# Patient Record
Sex: Male | Born: 1950 | Race: White | Hispanic: No | Marital: Married | Smoking: Never smoker
Health system: Southern US, Community
[De-identification: ages and names within clinical notes are randomized; demographics above are authoritative.]

## PROBLEM LIST (undated history)

## (undated) DIAGNOSIS — Z8616 Personal history of COVID-19: Secondary | ICD-10-CM

## (undated) DIAGNOSIS — I4819 Other persistent atrial fibrillation: Secondary | ICD-10-CM

## (undated) DIAGNOSIS — I517 Cardiomegaly: Secondary | ICD-10-CM

## (undated) DIAGNOSIS — I340 Nonrheumatic mitral (valve) insufficiency: Secondary | ICD-10-CM

## (undated) DIAGNOSIS — Z7901 Long term (current) use of anticoagulants: Secondary | ICD-10-CM

## (undated) DIAGNOSIS — I4510 Unspecified right bundle-branch block: Secondary | ICD-10-CM

## (undated) DIAGNOSIS — I7300 Raynaud's syndrome without gangrene: Secondary | ICD-10-CM

## (undated) DIAGNOSIS — C61 Malignant neoplasm of prostate: Secondary | ICD-10-CM

## (undated) HISTORY — PX: MOHS SURGERY: SHX181

## (undated) HISTORY — DX: Raynaud's syndrome without gangrene: I73.00

## (undated) HISTORY — DX: Nonrheumatic mitral (valve) insufficiency: I34.0

## (undated) HISTORY — PX: COLONOSCOPY: SHX174

## (undated) HISTORY — PX: SKIN SURGERY: SHX2413

## (undated) HISTORY — DX: Other persistent atrial fibrillation: I48.19

## (undated) HISTORY — DX: Cardiomegaly: I51.7

---

## 1999-07-03 ENCOUNTER — Encounter: Payer: Self-pay | Admitting: *Deleted

## 1999-07-03 ENCOUNTER — Encounter: Admission: RE | Admit: 1999-07-03 | Discharge: 1999-07-03 | Payer: Self-pay | Admitting: *Deleted

## 1999-10-23 ENCOUNTER — Encounter: Payer: Self-pay | Admitting: *Deleted

## 1999-10-23 ENCOUNTER — Encounter: Admission: RE | Admit: 1999-10-23 | Discharge: 1999-10-23 | Payer: Self-pay | Admitting: *Deleted

## 1999-11-06 ENCOUNTER — Encounter: Payer: Self-pay | Admitting: Neurology

## 1999-11-06 ENCOUNTER — Ambulatory Visit (HOSPITAL_COMMUNITY): Admission: RE | Admit: 1999-11-06 | Discharge: 1999-11-06 | Payer: Self-pay | Admitting: Neurology

## 1999-11-08 ENCOUNTER — Ambulatory Visit (HOSPITAL_COMMUNITY): Admission: RE | Admit: 1999-11-08 | Discharge: 1999-11-08 | Payer: Self-pay | Admitting: Neurological Surgery

## 1999-11-08 ENCOUNTER — Encounter: Payer: Self-pay | Admitting: Neurological Surgery

## 1999-11-23 ENCOUNTER — Encounter: Payer: Self-pay | Admitting: Neurological Surgery

## 1999-11-23 ENCOUNTER — Ambulatory Visit (HOSPITAL_COMMUNITY): Admission: RE | Admit: 1999-11-23 | Discharge: 1999-11-23 | Payer: Self-pay | Admitting: Neurological Surgery

## 1999-12-07 ENCOUNTER — Encounter: Payer: Self-pay | Admitting: Neurological Surgery

## 1999-12-07 ENCOUNTER — Ambulatory Visit (HOSPITAL_COMMUNITY): Admission: RE | Admit: 1999-12-07 | Discharge: 1999-12-07 | Payer: Self-pay | Admitting: Neurological Surgery

## 2001-04-27 ENCOUNTER — Encounter: Payer: Self-pay | Admitting: Internal Medicine

## 2001-10-13 ENCOUNTER — Encounter: Payer: Self-pay | Admitting: Gastroenterology

## 2002-09-06 HISTORY — PX: ROTATOR CUFF REPAIR: SHX139

## 2003-02-15 ENCOUNTER — Ambulatory Visit (HOSPITAL_COMMUNITY): Admission: RE | Admit: 2003-02-15 | Discharge: 2003-02-15 | Payer: Self-pay | Admitting: Neurosurgery

## 2003-02-15 ENCOUNTER — Encounter: Payer: Self-pay | Admitting: Neurosurgery

## 2003-05-17 ENCOUNTER — Emergency Department (HOSPITAL_COMMUNITY): Admission: EM | Admit: 2003-05-17 | Discharge: 2003-05-17 | Payer: Self-pay

## 2004-10-05 ENCOUNTER — Encounter: Admission: RE | Admit: 2004-10-05 | Discharge: 2004-10-05 | Payer: Self-pay | Admitting: Family Medicine

## 2004-10-13 ENCOUNTER — Encounter: Admission: RE | Admit: 2004-10-13 | Discharge: 2004-10-13 | Payer: Self-pay | Admitting: Family Medicine

## 2004-10-27 ENCOUNTER — Encounter: Admission: RE | Admit: 2004-10-27 | Discharge: 2004-10-27 | Payer: Self-pay | Admitting: Family Medicine

## 2004-11-16 ENCOUNTER — Ambulatory Visit: Payer: Self-pay | Admitting: Internal Medicine

## 2006-12-05 ENCOUNTER — Encounter: Admission: RE | Admit: 2006-12-05 | Discharge: 2006-12-05 | Payer: Self-pay | Admitting: Family Medicine

## 2006-12-08 ENCOUNTER — Ambulatory Visit: Payer: Self-pay | Admitting: Internal Medicine

## 2006-12-26 ENCOUNTER — Ambulatory Visit: Payer: Self-pay | Admitting: Internal Medicine

## 2006-12-26 LAB — CONVERTED CEMR LAB: PSA: 1.25 ng/mL (ref 0.10–4.00)

## 2007-01-20 ENCOUNTER — Encounter: Payer: Self-pay | Admitting: Internal Medicine

## 2007-01-20 ENCOUNTER — Ambulatory Visit: Payer: Self-pay | Admitting: Internal Medicine

## 2008-02-21 ENCOUNTER — Telehealth: Payer: Self-pay | Admitting: Internal Medicine

## 2008-03-04 ENCOUNTER — Telehealth (INDEPENDENT_AMBULATORY_CARE_PROVIDER_SITE_OTHER): Payer: Self-pay | Admitting: *Deleted

## 2008-03-04 ENCOUNTER — Ambulatory Visit: Payer: Self-pay | Admitting: Internal Medicine

## 2008-03-15 ENCOUNTER — Encounter (INDEPENDENT_AMBULATORY_CARE_PROVIDER_SITE_OTHER): Payer: Self-pay | Admitting: *Deleted

## 2008-03-15 LAB — CONVERTED CEMR LAB
ALT: 19 units/L (ref 0–53)
Alkaline Phosphatase: 48 units/L (ref 39–117)
Basophils Absolute: 0.3 10*3/uL — ABNORMAL HIGH (ref 0.0–0.1)
Bilirubin, Direct: 0.1 mg/dL (ref 0.0–0.3)
CO2: 31 meq/L (ref 19–32)
Calcium: 9.4 mg/dL (ref 8.4–10.5)
Cholesterol: 206 mg/dL (ref 0–200)
Glucose, Bld: 102 mg/dL — ABNORMAL HIGH (ref 70–99)
HDL: 83.5 mg/dL (ref >39.0)
Lymphocytes Relative: 38.9 % (ref 12.0–46.0)
MCHC: 34.1 g/dL (ref 30.0–36.0)
Monocytes Relative: 8.5 % (ref 3.0–12.0)
Neutrophils Relative %: 34.8 % — ABNORMAL LOW (ref 43.0–77.0)
PSA: 1.38 ng/mL (ref 0.10–4.00)
Platelets: 303 10*3/uL (ref 150–400)
Potassium: 5.3 meq/L — ABNORMAL HIGH (ref 3.5–5.1)
RDW: 12.7 % (ref 11.5–14.6)
Sodium: 142 meq/L (ref 135–145)
TSH: 2.21 microintl units/mL (ref 0.35–5.50)
Total Bilirubin: 0.7 mg/dL (ref 0.3–1.2)
Total CHOL/HDL Ratio: 2.5
Total Protein: 6.9 g/dL (ref 6.0–8.3)
Triglycerides: 76 mg/dL (ref 0–149)
VLDL: 15 mg/dL (ref 0–40)

## 2008-03-18 ENCOUNTER — Telehealth (INDEPENDENT_AMBULATORY_CARE_PROVIDER_SITE_OTHER): Payer: Self-pay | Admitting: *Deleted

## 2008-03-25 ENCOUNTER — Ambulatory Visit: Payer: Self-pay | Admitting: Internal Medicine

## 2008-04-08 ENCOUNTER — Ambulatory Visit: Payer: Self-pay | Admitting: Internal Medicine

## 2008-04-08 DIAGNOSIS — M5137 Other intervertebral disc degeneration, lumbosacral region: Secondary | ICD-10-CM | POA: Insufficient documentation

## 2008-04-08 DIAGNOSIS — L989 Disorder of the skin and subcutaneous tissue, unspecified: Secondary | ICD-10-CM | POA: Insufficient documentation

## 2008-04-08 DIAGNOSIS — R7309 Other abnormal glucose: Secondary | ICD-10-CM | POA: Insufficient documentation

## 2008-04-08 DIAGNOSIS — M5430 Sciatica, unspecified side: Secondary | ICD-10-CM | POA: Insufficient documentation

## 2008-04-08 DIAGNOSIS — E785 Hyperlipidemia, unspecified: Secondary | ICD-10-CM | POA: Insufficient documentation

## 2008-04-09 ENCOUNTER — Encounter (INDEPENDENT_AMBULATORY_CARE_PROVIDER_SITE_OTHER): Payer: Self-pay | Admitting: *Deleted

## 2008-05-08 ENCOUNTER — Encounter: Payer: Self-pay | Admitting: Internal Medicine

## 2008-06-12 ENCOUNTER — Telehealth (INDEPENDENT_AMBULATORY_CARE_PROVIDER_SITE_OTHER): Payer: Self-pay | Admitting: *Deleted

## 2008-06-17 ENCOUNTER — Ambulatory Visit: Payer: Self-pay | Admitting: Internal Medicine

## 2008-06-18 ENCOUNTER — Encounter (INDEPENDENT_AMBULATORY_CARE_PROVIDER_SITE_OTHER): Payer: Self-pay | Admitting: *Deleted

## 2008-06-18 LAB — CONVERTED CEMR LAB
Creatinine,U: 31.4 mg/dL
Microalb Creat Ratio: 6.4 mg/g (ref 0.0–30.0)
Microalb, Ur: 0.2 mg/dL (ref 0.0–1.9)

## 2008-07-23 ENCOUNTER — Ambulatory Visit: Payer: Self-pay | Admitting: Internal Medicine

## 2008-07-23 DIAGNOSIS — R9431 Abnormal electrocardiogram [ECG] [EKG]: Secondary | ICD-10-CM | POA: Insufficient documentation

## 2008-07-30 ENCOUNTER — Encounter (INDEPENDENT_AMBULATORY_CARE_PROVIDER_SITE_OTHER): Payer: Self-pay | Admitting: *Deleted

## 2009-05-19 ENCOUNTER — Telehealth: Payer: Self-pay | Admitting: Internal Medicine

## 2009-05-19 DIAGNOSIS — T887XXA Unspecified adverse effect of drug or medicament, initial encounter: Secondary | ICD-10-CM | POA: Insufficient documentation

## 2009-05-23 ENCOUNTER — Ambulatory Visit: Payer: Self-pay | Admitting: Internal Medicine

## 2009-06-09 ENCOUNTER — Encounter (INDEPENDENT_AMBULATORY_CARE_PROVIDER_SITE_OTHER): Payer: Self-pay | Admitting: *Deleted

## 2009-09-03 ENCOUNTER — Encounter (INDEPENDENT_AMBULATORY_CARE_PROVIDER_SITE_OTHER): Payer: Self-pay | Admitting: *Deleted

## 2009-09-03 ENCOUNTER — Ambulatory Visit: Payer: Self-pay | Admitting: Internal Medicine

## 2009-09-03 DIAGNOSIS — J309 Allergic rhinitis, unspecified: Secondary | ICD-10-CM | POA: Insufficient documentation

## 2009-09-03 DIAGNOSIS — Z8719 Personal history of other diseases of the digestive system: Secondary | ICD-10-CM | POA: Insufficient documentation

## 2009-09-03 DIAGNOSIS — I7300 Raynaud's syndrome without gangrene: Secondary | ICD-10-CM | POA: Insufficient documentation

## 2009-09-04 ENCOUNTER — Encounter (INDEPENDENT_AMBULATORY_CARE_PROVIDER_SITE_OTHER): Payer: Self-pay | Admitting: *Deleted

## 2009-09-09 ENCOUNTER — Telehealth: Payer: Self-pay | Admitting: Internal Medicine

## 2009-09-11 ENCOUNTER — Encounter: Payer: Self-pay | Admitting: Internal Medicine

## 2009-09-30 ENCOUNTER — Encounter (INDEPENDENT_AMBULATORY_CARE_PROVIDER_SITE_OTHER): Payer: Self-pay | Admitting: *Deleted

## 2009-09-30 ENCOUNTER — Ambulatory Visit: Payer: Self-pay | Admitting: Gastroenterology

## 2009-09-30 DIAGNOSIS — K6289 Other specified diseases of anus and rectum: Secondary | ICD-10-CM | POA: Insufficient documentation

## 2009-10-03 ENCOUNTER — Ambulatory Visit: Payer: Self-pay | Admitting: Gastroenterology

## 2010-10-04 LAB — CONVERTED CEMR LAB
ALT: 27 units/L (ref 0–40)
ALT: 36 units/L (ref 0–53)
AST: 21 units/L (ref 0–37)
AST: 30 units/L (ref 0–37)
Albumin: 4.2 g/dL (ref 3.5–5.2)
Alkaline Phosphatase: 36 units/L — ABNORMAL LOW (ref 39–117)
Alkaline Phosphatase: 52 units/L (ref 39–117)
BUN: 18 mg/dL (ref 6–23)
Basophils Absolute: 0 10*3/uL (ref 0.0–0.1)
Basophils Absolute: 0 10*3/uL (ref 0.0–0.1)
Basophils Relative: 0.8 % (ref 0.0–3.0)
Basophils Relative: 0.9 % (ref 0.0–3.0)
Bilirubin, Direct: 0 mg/dL (ref 0.0–0.3)
Bilirubin, Direct: 0.1 mg/dL (ref 0.0–0.3)
CO2: 30 meq/L (ref 19–32)
CO2: 31 meq/L (ref 19–32)
Calcium: 9.5 mg/dL (ref 8.4–10.5)
Calcium: 9.6 mg/dL (ref 8.4–10.5)
Chloride: 101 meq/L (ref 96–112)
Chloride: 105 meq/L (ref 96–112)
Cholesterol: 204 mg/dL (ref 0–200)
Cholesterol: 211 mg/dL — ABNORMAL HIGH (ref 0–200)
Creatinine, Ser: 0.8 mg/dL (ref 0.4–1.5)
Creatinine, Ser: 0.9 mg/dL (ref 0.4–1.5)
Direct LDL: 101.2 mg/dL
Direct LDL: 88.3 mg/dL
Eosinophils Absolute: 0.4 10*3/uL (ref 0.0–0.7)
Eosinophils Relative: 1.8 % (ref 0.0–5.0)
HCT: 41.8 % (ref 39.0–52.0)
HCT: 46.3 % (ref 39.0–52.0)
Hemoglobin: 14.2 g/dL (ref 13.0–17.0)
Lymphocytes Relative: 36.2 % (ref 12.0–46.0)
Lymphocytes Relative: 44.2 % (ref 12.0–46.0)
MCHC: 33.2 g/dL (ref 30.0–36.0)
MCHC: 33.9 g/dL (ref 30.0–36.0)
MCV: 87.6 fL (ref 78.0–100.0)
Monocytes Absolute: 0.5 10*3/uL (ref 0.1–1.0)
Neutrophils Relative %: 36.3 % — ABNORMAL LOW (ref 43.0–77.0)
Neutrophils Relative %: 43 % (ref 43.0–77.0)
Neutrophils Relative %: 47.4 % (ref 43.0–77.0)
PSA: 1.33 ng/mL (ref 0.10–4.00)
Platelets: 248 10*3/uL (ref 150.0–400.0)
Platelets: 353 10*3/uL (ref 150–400)
Potassium: 4.7 meq/L (ref 3.5–5.1)
RBC: 5.11 M/uL (ref 4.22–5.81)
RBC: 5.4 M/uL (ref 4.22–5.81)
RDW: 12.8 % (ref 11.5–14.6)
Sodium: 139 meq/L (ref 135–145)
Sodium: 143 meq/L (ref 135–145)
TSH: 2.11 microintl units/mL (ref 0.35–5.50)
Total Bilirubin: 0.7 mg/dL (ref 0.3–1.2)
Total Bilirubin: 1 mg/dL (ref 0.3–1.2)
Total Bilirubin: 1 mg/dL (ref 0.3–1.2)
Total CHOL/HDL Ratio: 2
Total CHOL/HDL Ratio: 2.1
Total CHOL/HDL Ratio: 2.2
Total Protein: 6.9 g/dL (ref 6.0–8.3)
Triglycerides: 34 mg/dL (ref 0–149)
Triglycerides: 52 mg/dL (ref 0.0–149.0)
Triglycerides: 62 mg/dL (ref 0–149)
VLDL: 10.4 mg/dL (ref 0.0–40.0)
Vit D, 25-Hydroxy: 37 ng/mL (ref 30–89)
WBC: 3.8 10*3/uL — ABNORMAL LOW (ref 4.5–10.5)
WBC: 4.1 10*3/uL — ABNORMAL LOW (ref 4.5–10.5)
WBC: 5.4 10*3/uL (ref 4.5–10.5)

## 2010-10-08 NOTE — Procedures (Signed)
Summary: Colonoscopy  Patient: Grant Griffin Note: All result statuses are Final unless otherwise noted.  Tests: (1) Colonoscopy (COL)   COL Colonoscopy           DONE     [facility]     [address]     [place], [place]  [address]           COLONOSCOPY PROCEDURE REPORT           PATIENT:  Grant Griffin, Grant Griffin  MR#:  [id number]     BIRTHDATE:  1950/09/24, 58 yrs. old  GENDER:  male           ENDOSCOPIST:  Vania Rea. Jarold Motto, MD, [facility]     Referred by:           PROCEDURE DATE:  10/03/2009     PROCEDURE:     ASA CLASS:  Class II     INDICATIONS:  rectal bleeding           MEDICATIONS:   Fentanyl 75 mcg IV, Versed 7 mg IV           DESCRIPTION OF PROCEDURE:   After the risks benefits and     alternatives of the procedure were thoroughly explained, informed     consent was obtained.  Digital rectal exam was performed and     revealed no abnormalities.   The LB CF-[id number] endoscope     was introduced through the anus and advanced to the cecum, which     was identified by both the appendix and ileocecal valve, without     limitations.  The quality of the prep was excellent, using     MoviPrep.  The instrument was then slowly withdrawn as the colon     was fully examined.     <<PROCEDUREIMAGES>>           FINDINGS:  Scattered diverticula were found.  No polyps or cancers     were seen.   Retroflexed views in the rectum revealed no     abnormalities.    The scope was then withdrawn from the patient     and the procedure completed.           COMPLICATIONS:  None           ENDOSCOPIC IMPRESSION:     1) Diverticula, scattered     2) No polyps or cancers     RECOMMENDATIONS:     1) high fiber diet     2) Continue current colorectal screening recommendations for     "routine risk" patients with a repeat colonoscopy in 10 years.           REPEAT EXAM:  No           ______________________________     Vania Rea. Jarold Motto, MD, [facility]           CC:  Pecola Lawless, MD       n.     Rosalie DoctorMarland Kitchen   Vania Rea. Patterson at 10/03/2009 02:03 PM           Princess Bruins, [id number]  Note: An exclamation mark (!) indicates a result that was not dispersed into the flowsheet. Document Creation Date: 10/03/2009 2:04 PM _______________________________________________________________________  (1) Order result status: Final Collection or observation date-time: 10/03/2009 13:53 Requested date-time:  Receipt date-time:  Reported date-time:  Referring Physician:   Ordering Physician: Sheryn Bison [phone]) Specimen Source:  Source: Launa Grill Order Number: [id number] Lab site:  Appended Document: Colonoscopy    Clinical Lists Changes  Observations: Added new observation of COLONNXTDUE: 09/2019 (10/03/2009 14:22)

## 2010-10-08 NOTE — Progress Notes (Signed)
Summary: A1c concerns-NON Urgent  Phone Note Call from Patient Call back at Home Phone [phone]   Caller: Patient Summary of Call: Patient would like to know if he should come back in for an A1c, he said he usually gets it checked and it wasnt checked with last labs. I looked at [facility] and glucose was good.   Dr.Jeneal Vogl please advise if A1c is something patient should come back in for?   Marland KitchenShonna Chock  September 09, 2009 8:40 AM   Follow-up for Phone Call        add A1c (790.29) Follow-up by: Marga Melnick MD,  September 09, 2009 3:45 PM

## 2010-10-08 NOTE — Letter (Signed)
Summary: Cancer Screening/[facility] Personalized Risk Profile  Cancer Screening/[facility] Personalized Risk Profile   Imported By: Lanelle Bal 09/11/2009 09:10:45  _____________________________________________________________________  External Attachment:    Type:   Image     Comment:   External Document

## 2010-10-08 NOTE — Letter (Signed)
Summary: [facility] Instructions  [facility]  [address] [place], [place] [address]   Phone: [phone]  Fax: [phone]       DEKKER VERGA    30-May-1951    MRN: [id number]        Procedure Day /Date: Friday, 10/03/09     Arrival Time: 12:30      Procedure Time: 1:30     Location of Procedure:                    Juliann Pares  [facility] (4th Floor)                         PREPARATION FOR COLONOSCOPY WITH MOVIPREP   Starting today do not eat nuts, seeds, popcorn, corn, beans, peas,  salads, or any raw vegetables.  Do not take any fiber supplements (e.g. Metamucil, Citrucel, and Benefiber).  THE DAY BEFORE YOUR PROCEDURE         DATE: 10/02/09  DAY: Thursday  1.  Drink clear liquids the entire day-NO SOLID FOOD  2.  Do not drink anything colored red or purple.  Avoid juices with pulp.  No orange juice.  3.  Drink at least 64 oz. (8 glasses) of fluid/clear liquids during the day to prevent dehydration and help the prep work efficiently.  CLEAR LIQUIDS INCLUDE: Water Jello Ice Popsicles Tea (sugar ok, no milk/cream) Powdered fruit flavored drinks Coffee (sugar ok, no milk/cream) Gatorade Juice: apple, white grape, white cranberry  Lemonade Clear bullion, consomm, broth Carbonated beverages (any kind) Strained chicken noodle soup Hard Candy                             4.  In the morning, mix first dose of MoviPrep solution:    Empty 1 Pouch A and 1 Pouch B into the disposable container    Add lukewarm drinking water to the top line of the container. Mix to dissolve    Refrigerate (mixed solution should be used within 24 hrs)  5.  Begin drinking the prep at 5:00 p.m. The MoviPrep container is divided by 4 marks.   Every 15 minutes drink the solution down to the next mark (approximately 8 oz) until the full liter is complete.   6.  Follow completed prep with 16 oz of clear liquid of your choice (Nothing red or purple).  Continue to drink  clear liquids until bedtime.  7.  Before going to bed, mix second dose of MoviPrep solution:    Empty 1 Pouch A and 1 Pouch B into the disposable container    Add lukewarm drinking water to the top line of the container. Mix to dissolve    Refrigerate  THE DAY OF YOUR PROCEDURE      DATE: 10/03/09  DAY: Friday  Beginning at 8:30 a.m. (5 hours before procedure):         1. Every 15 minutes, drink the solution down to the next mark (approx 8 oz) until the full liter is complete.  2. Follow completed prep with 16 oz. of clear liquid of your choice.    3. You may drink clear liquids until 11:30 (2 HOURS BEFORE PROCEDURE).   MEDICATION INSTRUCTIONS  Unless otherwise instructed, you should take regular prescription medications with a small sip of water   as early as possible the morning of your procedure.  OTHER INSTRUCTIONS  You will need a responsible adult at least 62 years of age to accompany you and drive you home.   This person must remain in the waiting room during your procedure.  Wear loose fitting clothing that is easily removed.  Leave jewelry and other valuables at home.  However, you may wish to bring a book to read or  an iPod/MP3 player to listen to music as you wait for your procedure to start.  Remove all body piercing jewelry and leave at home.  Total time from sign-in until discharge is approximately 2-3 hours.  You should go home directly after your procedure and rest.  You can resume normal activities the  day after your procedure.  The day of your procedure you should not:   Drive   Make legal decisions   Operate machinery   Drink alcohol   Return to work  You will receive specific instructions about eating, activities and medications before you leave.    The above instructions have been reviewed and explained to me by   _______________________    I fully understand and can verbalize these instructions  _____________________________ Date _________

## 2010-10-08 NOTE — Assessment & Plan Note (Signed)
Summary: ? 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  He has had previous hemorrhoidectomy in 1998. Colonoscopy in 2003 was unremarkable. In December he had several days of rectal bleeding with hard stools which has resolved. He currently denies abdominal or rectal pain or rectal bleeding or any GI complaints. Appetite is good and he has voluntarily lost 30 pounds in weight over the last year. He denies any specific food intolerances, reflux symptoms, dysphagia, or any hepatobiliary or systemic complaints. He is on daily aspirin tablet and vitamin D. Family history is noncontributory.   GI Review of Systems      Denies abdominal pain, acid reflux, belching, bloating, chest pain, dysphagia with liquids, dysphagia with solids, heartburn, loss of appetite, nausea, vomiting, vomiting blood, weight loss, and  weight gain.      Reports hemorrhoids, rectal bleeding, and  rectal pain.     Denies anal fissure, black tarry stools, change in bowel habit, constipation, diarrhea, diverticulosis, fecal incontinence, heme positive stool, irritable bowel syndrome, jaundice, light color stool, and  liver problems.    Current Medications (verified): 1)  Aspirin 81 Mg Tabs (Aspirin) .Marland Kitchen.. 1 By Mouth Once Daily 2)  Loratadine 10 Mg Tabs (Loratadine) .Marland Kitchen.. 1 Once Daily As Needed Allergies 3)  Vitamin D 1000 Unit Tabs (Cholecalciferol) .... One Tablet By Mouth Once Daily  Allergies (verified): No Known Drug Allergies  Past History:  Past medical, surgical, family and social histories (including risk factors) reviewed for  relevance to current acute and chronic problems.  Past Medical History: Reviewed history from 09/03/2009 and no changes required. 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 Hemorrhoidectomy; Wisdom Teeth Extraction Rotator cuff repair 2004, Dr Salvatore Marvel Colonoscopy 2004 neg , due 2013 as per Dr Jarold Motto  Family History: Reviewed history from 09/03/2009 and no changes required. Father: MI died @  age 55, Macular  Degeneration, glaucoma,smoker ; M  Mac degen,THR bilat; bro benign  thyroid tumor ; MGF  HTN, lipids,? liver disease; P aunt MI, HTN, lipids; MGM polyps, Mac Degen No FH of Colon Cancer:  Social History: Reviewed history from 09/03/2009 and no changes required. no diet Retired Horticulturist, commercial Married Never Smoked Alcohol use-yes: socailly: 1-2  Regular exercise-no Daily Caffeine Use: 1-2 daily   Review of Systems       The patient complains of allergy/sinus, back pain, and heart rhythm changes.    Vital Signs:  Patient profile:   60 year old male Height:      68.5 inches Weight:      183 pounds BMI:     27.52 BSA:     1.98 Pulse rate:   56 / minute Pulse rhythm:   regular BP sitting:   126 / 64  (left arm) Cuff size:   regular  Vitals  Entered By: Ok Anis CMA (September 30, 2009 8:31 AM)  Physical Exam  General:  Well developed, well nourished, no acute distress.healthy appearing.   Head:  Normocephalic and atraumatic. Eyes:  PERRLA, no icterus.exam deferred to patient's ophthalmologist.   Lungs:  Clear throughout to auscultation. Heart:  Regular rate and rhythm; no murmurs, rubs,  or bruits. Abdomen:   Soft, nontender and nondistended. No masses, hepatosplenomegaly or hernias noted. Normal bowel sounds. Rectal:  There is a posterior skin tag visible but no fissure or fistula. Rectal exam is deferred at his request.deferred until time of colonoscopy.   Extremities:  No clubbing, cyanosis, edema or deformities noted. Neurologic:  Alert and  oriented x4;  grossly normal neurologically. Psych:  Alert and cooperative. Normal mood and affect.   Impression & Recommendations:  Problem # 1:  RECTAL PAIN (YQM-578.46) Assessment Improved Probable resolved superficial fissure...  Problem # 2:  RECTAL BLEEDING, HX OF (ICD-V12.79) Assessment: Improved Colonoscopy scheduled at his convenience to exclude rectosigmoid polyposis.  Problem # 3:  DISC DISEASE, LUMBOSACRAL SPINE (ICD-722.52) Assessment: Comment Only  Patient Instructions: 1)  Copy sent to : Dr. Marga Melnick 2)  Please continue current medications.  3)  Constipation and Hemorrhoids brochure given.  4)  Colonoscopy and Flexible Sigmoidoscopy brochure given.  5)  Conscious Sedation brochure given.  6)  The medication list was reviewed and reconciled.  All changed / newly prescribed medications were explained.  A complete medication list was provided to the patient / caregiver.  Appended Document: ? fissure or hemorroid bleed...em    Clinical Lists Changes  Medications: Added new medication of MOVIPREP 100 GM  SOLR (PEG-KCL-NACL-NASULF-NA ASC-C) As per prep instructions. - Signed Rx of MOVIPREP 100 GM  SOLR (PEG-KCL-NACL-NASULF-NA ASC-C) As per prep instructions.;  #1 x 0;  Signed;  Entered by: Ashok Cordia RN;  Authorized by: Mardella Layman MD [facility];  Method used: Electronically to [facility]. [phone]*, [address], [place], [place], [place]  [address], Ph: [phone], Fax: [phone] Orders: Added new Test order of Colonoscopy (Colon) - Signed    Prescriptions: MOVIPREP 100 GM  SOLR  (PEG-KCL-NACL-NASULF-NA ASC-C) As per prep instructions.  #1 x 0   Entered by:   Ashok Cordia RN   Authorized by:   Mardella Layman MD [facility]   Signed by:   Ashok Cordia RN on 09/30/2009   Method used:   Electronically to        [facility]. [phone]* (retail)       [address]       [place], [place]  [address]       Ph: [phone]       Fax: [phone]   RxID:   [id number]

## 2011-01-22 NOTE — Assessment & Plan Note (Signed)
[facility]                        [place] OFFICE NOTE   NAME:Grant Griffin, Grant Griffin                          MRN:          [id number]  DATE:12/08/2006                            DOB:          [birth year]    Grant Griffin was seen for a comprehensive physical examination on December 08, 2006 at age 60.  His major clinical problems are musculoskelet (DJD) &  neuromuscular (spinal stenosis), manifested by ongoing pain in the left  shoulder and lower back with radiation to the left hip and leg.  He had  been treated with approximately 9 steroid injections over the past 6  years.  He states he averages injections every 1 or 2 years; the most  recent were in late March.  He also has discomfort in the knee joints  and can no longer tolerate running or high-impact exercise.  He is using  ibuprofen 400 mg twice a day.   He also has pursued physical therapy for lumbar degenerative disk  disease and is followed by Dr. Lavada Mesi.   His past history includes hemorrhoidectomy.  He had a herniated disk at  L4-S1 and was seen by Dr. Gerlene Fee, a neurosurgeon, previously.  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  He has also had basal cell skin cancers.  His exercise is  also compromised by a history of plantar fasciitis.   His father died of a myocardial infarction at age 71 and was a smoker.  There is no family history of cancer, diabetes, hypertension or stroke.   He has never smoked.  He drinks socially.   He has no known drug allergies.  He is on a healthy diet.   REVIEW OF SYSTEMS:  Also reveals insomnia related to the back pain and  shoulder pain.   PHYSICAL EXAMINATION:  VITAL SIGNS:  He is 5 feet 10 inches and weighs  202, which is up approximately 5 pounds, most likely related to the  decrease in exercise.  VITAL SIGNS:  He was afebrile, pulse was 60, respiratory rate 12, and  blood pressure 124/82.  HEENT:  Minor arteriolar narrowing is noted.  Otolaryngologic exam is  normal.  Dental hygiene is excellent.  NECK:  His thyroid is normal to palpation.  CARDIOPULMONARY:  Unremarkable.  ABDOMEN:  He has no organomegaly or masses of the abdomen.  LYMPHATIC:  He has no lymphadenopathy about the head, neck or axillae.  RECTAL:  Hemoccult testing is negative.  Prostate is upper limits of  normal.  EXTREMITIES:  He has some discomfort in the left lower extremity with  elevation to 60 degrees.  He has decreased range of motion on the left  hip and left knee with marked crepitus.  NEUROPSYCHIATRIC:  Unremarkable otherwise.   Normal and negative studies include CBC and differential, hepatic  profile, BMET, TSH, PSA.  Although his glucose was 99, his A1c is 6.  I  am concerned about a predisposition to diabetes and/or osteopenia with  the need for recurrent steroid injections.  I have recommended that  he  visit the prevention.com web site, The [employer] Diet.  Bone mineral  density study will also be completed.   Although his total cholesterol was 211, his HDL was 94 and LDL only 83.  No intervention other than the dietary recommendations is needed.   He will continue to be followed by Dr. Prince Rome for the degenerative disk  disease and associated spinal stenosis.  Exercise would be limited to  non-impact such as swimming, possibly elliptical machine or stationary  bike.   A copy of this will be sent to the patient to share with Dr. Prince Rome.     Titus Dubin. Alwyn Ren, MD,FACP,FCCP  Electronically Signed    [facility]  DD: 01/04/2007  DT: 01/04/2007  Job #: [id number]

## 2012-07-27 ENCOUNTER — Other Ambulatory Visit: Payer: Self-pay | Admitting: Internal Medicine

## 2012-07-27 DIAGNOSIS — Z0000 Encounter for general adult medical examination without abnormal findings: Secondary | ICD-10-CM

## 2012-07-27 LAB — LIPID PANEL
Cholesterol: 198 mg/dL (ref 0–200)
LDL Cholesterol: 111 mg/dL — ABNORMAL HIGH (ref 0–99)
Triglycerides: 33 mg/dL (ref <150)
VLDL: 7 mg/dL (ref 0–40)

## 2012-07-27 LAB — COMPREHENSIVE METABOLIC PANEL
ALT: 19 U/L (ref 0–53)
Albumin: 4.3 g/dL (ref 3.5–5.2)
CO2: 31 mEq/L (ref 19–32)
Chloride: 105 mEq/L (ref 96–112)
Glucose, Bld: 98 mg/dL (ref 70–99)
Potassium: 5 mEq/L (ref 3.5–5.3)
Sodium: 141 mEq/L (ref 135–145)
Total Bilirubin: 0.4 mg/dL (ref 0.3–1.2)
Total Protein: 6.5 g/dL (ref 6.0–8.3)

## 2012-07-27 LAB — CBC WITH DIFFERENTIAL/PLATELET
Eosinophils Absolute: 0.5 10*3/uL (ref 0.0–0.7)
Hemoglobin: 14.7 g/dL (ref 13.0–17.0)
Lymphocytes Relative: 45 % (ref 12–46)
Lymphs Abs: 1.8 10*3/uL (ref 0.7–4.0)
MCH: 29.3 pg (ref 26.0–34.0)
Monocytes Relative: 10 % (ref 3–12)
Neutro Abs: 1.3 10*3/uL — ABNORMAL LOW (ref 1.7–7.7)
Neutrophils Relative %: 32 % — ABNORMAL LOW (ref 43–77)
Platelets: 309 10*3/uL (ref 150–400)
RBC: 5.02 MIL/uL (ref 4.22–5.81)
WBC: 4 10*3/uL (ref 4.0–10.5)

## 2012-07-27 LAB — HEMOGLOBIN A1C: Mean Plasma Glucose: 117 mg/dL — ABNORMAL HIGH (ref <117)

## 2012-07-27 LAB — PSA: PSA: 1.36 ng/mL (ref <=4.00)

## 2012-07-28 ENCOUNTER — Ambulatory Visit (INDEPENDENT_AMBULATORY_CARE_PROVIDER_SITE_OTHER): Payer: BC Managed Care – PPO | Admitting: Internal Medicine

## 2012-07-28 ENCOUNTER — Encounter: Payer: Self-pay | Admitting: Internal Medicine

## 2012-07-28 VITALS — BP 126/70 | HR 68 | Ht 68.0 in | Wt 180.0 lb

## 2012-07-28 DIAGNOSIS — E7889 Other lipoprotein metabolism disorders: Secondary | ICD-10-CM

## 2012-07-28 DIAGNOSIS — Z8639 Personal history of other endocrine, nutritional and metabolic disease: Secondary | ICD-10-CM

## 2012-07-28 DIAGNOSIS — Z0000 Encounter for general adult medical examination without abnormal findings: Secondary | ICD-10-CM

## 2012-07-28 DIAGNOSIS — Z862 Personal history of diseases of the blood and blood-forming organs and certain disorders involving the immune mechanism: Secondary | ICD-10-CM

## 2012-07-28 DIAGNOSIS — Z87898 Personal history of other specified conditions: Secondary | ICD-10-CM

## 2012-07-28 DIAGNOSIS — E7800 Pure hypercholesterolemia, unspecified: Secondary | ICD-10-CM

## 2012-07-28 DIAGNOSIS — R0989 Other specified symptoms and signs involving the circulatory and respiratory systems: Secondary | ICD-10-CM

## 2012-07-28 LAB — POCT URINALYSIS DIPSTICK
Bilirubin, UA: NEGATIVE
Blood, UA: NEGATIVE
Glucose, UA: NEGATIVE
Leukocytes, UA: NEGATIVE
Nitrite, UA: NEGATIVE
Urobilinogen, UA: NEGATIVE
pH, UA: 5.5

## 2012-08-05 ENCOUNTER — Encounter: Payer: Self-pay | Admitting: Internal Medicine

## 2012-08-05 NOTE — Progress Notes (Signed)
  Subjective:    Patient ID: Grant Griffin, male    DOB: [birth year], 61 y.o.   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  No known drug allergies  No history of illnesses requiring hospitalization  Had left rotator cuff surgery in 2004  Takes vitamin D 2000 units daily and 81 mg of aspirin daily.  Was seen by cardiologist in January 2011 for abnormal EKG with left axis deviation and lateral T-wave inversions dating back some years. In 2004 he had a negative Cardiolite study. He does have family history of ischemic heart disease with her father having had an MI at age 3. Between 2004 and 2011 he lost 27 pounds and began exercising. He consumes wine. Has never smoked. Was diagnosed by cardiologist with a left fascicular hemiblock. Nuclear medicine stress test in 2011 was negative.  Social history: He is married. Has a son who is a Education officer, community and is moving to [place]. Has a daughter who lives in [place] who is a physical therapist.  Prior history of allergic rhinitis, Raynaud's phenomenon, sciatica.  His mother is living at age 63.  One brother in good health. One sister in good health. One sister overweight but generally healthy.  The patient enjoys Civil War history  and music.    Review of Systems  Constitutional: Negative.   Eyes: Negative.   Cardiovascular: Negative.   Gastrointestinal: Negative.   Musculoskeletal: Negative.   Neurological: Negative.   Hematological: Negative.   Psychiatric/Behavioral: Negative.        Objective:   Physical Exam  Vitals reviewed. Constitutional: He is oriented to person, place, and time. He appears well-developed and well-nourished. No distress.  HENT:  Head: Normocephalic and atraumatic.  Right Ear: External ear normal.  Left Ear: External ear normal.  Mouth/Throat: Oropharynx is clear and moist. No oropharyngeal exudate.    Eyes: Conjunctivae normal and EOM are normal. Pupils are equal, round, and reactive to light. Right eye exhibits no discharge. Left eye exhibits no discharge.  Neck: Normal range of motion. Neck supple. No JVD present. No thyromegaly present.  Cardiovascular: Normal rate, regular rhythm, normal heart sounds and intact distal pulses.   No murmur heard. Pulmonary/Chest: Effort normal and breath sounds normal. He has no rales.  Abdominal: Soft. Bowel sounds are normal. He exhibits no distension and no mass. There is no tenderness. There is no rebound and no guarding.  Genitourinary: Prostate normal.  Musculoskeletal: He exhibits no edema.  Lymphadenopathy:    He has no cervical adenopathy.  Neurological: He is alert and oriented to person, place, and time. He has normal reflexes. He displays normal reflexes. No cranial nerve deficit.  Skin: Skin is dry. No rash noted. He is not diaphoretic.  Psychiatric: He has a normal mood and affect. His behavior is normal. Judgment and thought content normal.          Assessment & Plan:  Hyperlipidemia-treated with diet  Impaired glucose  tolerance treated with diet  History of left fascicular hemiblock- seen by Dr. Donnie Aho in 2011.

## 2012-08-05 NOTE — Patient Instructions (Addendum)
May have Zostavax at pharmacy. Return 6- 12 months.

## 2012-08-07 ENCOUNTER — Encounter: Payer: Self-pay | Admitting: Internal Medicine

## 2014-07-07 HISTORY — PX: CATARACT EXTRACTION W/ INTRAOCULAR LENS IMPLANT: SHX1309

## 2014-07-08 DIAGNOSIS — H21561 Pupillary abnormality, right eye: Secondary | ICD-10-CM | POA: Diagnosis not present

## 2014-07-08 DIAGNOSIS — H25041 Posterior subcapsular polar age-related cataract, right eye: Secondary | ICD-10-CM | POA: Diagnosis not present

## 2014-07-08 DIAGNOSIS — H2589 Other age-related cataract: Secondary | ICD-10-CM | POA: Diagnosis not present

## 2014-07-08 DIAGNOSIS — H2511 Age-related nuclear cataract, right eye: Secondary | ICD-10-CM | POA: Diagnosis not present

## 2014-07-22 DIAGNOSIS — H2512 Age-related nuclear cataract, left eye: Secondary | ICD-10-CM | POA: Diagnosis not present

## 2014-07-29 DIAGNOSIS — H21562 Pupillary abnormality, left eye: Secondary | ICD-10-CM | POA: Diagnosis not present

## 2014-07-29 DIAGNOSIS — H2512 Age-related nuclear cataract, left eye: Secondary | ICD-10-CM | POA: Diagnosis not present

## 2014-07-29 DIAGNOSIS — H2589 Other age-related cataract: Secondary | ICD-10-CM | POA: Diagnosis not present

## 2014-08-16 ENCOUNTER — Telehealth: Payer: Self-pay

## 2014-08-16 NOTE — Telephone Encounter (Signed)
Patient is needing a hepititis b titer.  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  Can patient just come in the office and have done or does he need to be seen.  He has not been seen since 07/2012.  Patient is available Friday 08/23/2014.   Please advise.

## 2014-08-16 NOTE — Telephone Encounter (Signed)
Left message informing patient he can come in for hep b titer on 08/23/2014 between 9 and 12.

## 2014-08-16 NOTE — Telephone Encounter (Signed)
Dr. Woodfin Ganja may come any day for Hep B surface antibody titer that phlebotomist is here.

## 2014-08-23 ENCOUNTER — Other Ambulatory Visit: Payer: Medicare Other | Admitting: Internal Medicine

## 2014-08-23 DIAGNOSIS — Z125 Encounter for screening for malignant neoplasm of prostate: Secondary | ICD-10-CM | POA: Diagnosis not present

## 2014-08-23 DIAGNOSIS — Z1159 Encounter for screening for other viral diseases: Secondary | ICD-10-CM | POA: Diagnosis not present

## 2014-08-23 DIAGNOSIS — E785 Hyperlipidemia, unspecified: Secondary | ICD-10-CM | POA: Diagnosis not present

## 2014-08-23 DIAGNOSIS — Z0000 Encounter for general adult medical examination without abnormal findings: Secondary | ICD-10-CM

## 2014-08-23 DIAGNOSIS — Z130 Encounter for screening for diseases of the blood and blood-forming organs and certain disorders involving the immune mechanism: Secondary | ICD-10-CM

## 2014-08-23 DIAGNOSIS — Z13220 Encounter for screening for lipoid disorders: Secondary | ICD-10-CM

## 2014-08-23 LAB — CBC WITH DIFFERENTIAL/PLATELET
Basophils Absolute: 0 10*3/uL (ref 0.0–0.1)
Basophils Relative: 1 % (ref 0–1)
Eosinophils Absolute: 0.3 10*3/uL (ref 0.0–0.7)
Eosinophils Relative: 8 % — ABNORMAL HIGH (ref 0–5)
HEMATOCRIT: 44.5 % (ref 39.0–52.0)
HEMOGLOBIN: 15.2 g/dL (ref 13.0–17.0)
LYMPHS ABS: 1.6 10*3/uL (ref 0.7–4.0)
LYMPHS PCT: 37 % (ref 12–46)
MCH: 28.7 pg (ref 26.0–34.0)
MCHC: 34.2 g/dL (ref 30.0–36.0)
MCV: 84.1 fL (ref 78.0–100.0)
MONOS PCT: 11 % (ref 3–12)
MPV: 9.5 fL (ref 9.4–12.4)
Monocytes Absolute: 0.5 10*3/uL (ref 0.1–1.0)
NEUTROS ABS: 1.8 10*3/uL (ref 1.7–7.7)
NEUTROS PCT: 43 % (ref 43–77)
Platelets: 323 10*3/uL (ref 150–400)
RBC: 5.29 MIL/uL (ref 4.22–5.81)
RDW: 13.9 % (ref 11.5–15.5)
WBC: 4.2 10*3/uL (ref 4.0–10.5)

## 2014-08-23 LAB — HEMOGLOBIN A1C
HEMOGLOBIN A1C: 6 % — AB (ref <5.7)
MEAN PLASMA GLUCOSE: 126 mg/dL — AB (ref <117)

## 2014-08-23 LAB — HEPATITIS B SURFACE ANTIBODY, QUANTITATIVE: HEPATITIS B-POST: 474 m[IU]/mL

## 2014-08-23 LAB — COMPREHENSIVE METABOLIC PANEL
ALBUMIN: 4.2 g/dL (ref 3.5–5.2)
ALT: 22 U/L (ref 0–53)
AST: 22 U/L (ref 0–37)
Alkaline Phosphatase: 49 U/L (ref 39–117)
BUN: 19 mg/dL (ref 6–23)
CHLORIDE: 101 meq/L (ref 96–112)
CO2: 30 meq/L (ref 19–32)
Calcium: 9.6 mg/dL (ref 8.4–10.5)
Creat: 0.85 mg/dL (ref 0.50–1.35)
GLUCOSE: 96 mg/dL (ref 70–99)
POTASSIUM: 4.9 meq/L (ref 3.5–5.3)
SODIUM: 142 meq/L (ref 135–145)
TOTAL PROTEIN: 6.9 g/dL (ref 6.0–8.3)
Total Bilirubin: 0.5 mg/dL (ref 0.2–1.2)

## 2014-08-23 LAB — LIPID PANEL
CHOLESTEROL: 189 mg/dL (ref 0–200)
HDL: 97 mg/dL (ref >39)
LDL Cholesterol: 85 mg/dL (ref 0–99)
TRIGLYCERIDES: 37 mg/dL (ref <150)
Total CHOL/HDL Ratio: 1.9 Ratio
VLDL: 7 mg/dL (ref 0–40)

## 2014-08-24 LAB — PSA: PSA: 2.14 ng/mL (ref <=4.00)

## 2014-10-11 ENCOUNTER — Ambulatory Visit (INDEPENDENT_AMBULATORY_CARE_PROVIDER_SITE_OTHER): Payer: Medicare Other | Admitting: Internal Medicine

## 2014-10-11 ENCOUNTER — Encounter: Payer: Self-pay | Admitting: Internal Medicine

## 2014-10-11 VITALS — BP 108/82 | HR 54 | Temp 98.0°F | Wt 185.0 lb

## 2014-10-11 DIAGNOSIS — L989 Disorder of the skin and subcutaneous tissue, unspecified: Secondary | ICD-10-CM

## 2014-10-11 DIAGNOSIS — N400 Enlarged prostate without lower urinary tract symptoms: Secondary | ICD-10-CM

## 2014-10-11 DIAGNOSIS — Z0000 Encounter for general adult medical examination without abnormal findings: Secondary | ICD-10-CM | POA: Diagnosis not present

## 2014-10-11 DIAGNOSIS — R7302 Impaired glucose tolerance (oral): Secondary | ICD-10-CM

## 2014-10-14 ENCOUNTER — Telehealth: Payer: Self-pay | Admitting: Internal Medicine

## 2014-10-14 NOTE — Telephone Encounter (Signed)
I can not obtain image. I do not have proper log in info

## 2014-10-14 NOTE — Telephone Encounter (Signed)
It is filed in the [employer] system; I could not get into either. Dr Renold Genta will need to get IT to retrieve it

## 2014-10-14 NOTE — Telephone Encounter (Signed)
Sent a fax explaining to contact IT

## 2014-10-14 NOTE — Telephone Encounter (Signed)
Dr. Renold Genta is trying to look at an EKG for 09/03/2009 that Dr. Linna Darner ordered.  She can not get the image to pop up in Epic.  She is requesting this to be faxed over to her at [phone].

## 2014-10-15 ENCOUNTER — Ambulatory Visit (INDEPENDENT_AMBULATORY_CARE_PROVIDER_SITE_OTHER): Payer: Medicare Other | Admitting: Internal Medicine

## 2014-10-15 DIAGNOSIS — H401431 Capsular glaucoma with pseudoexfoliation of lens, bilateral, mild stage: Secondary | ICD-10-CM | POA: Diagnosis not present

## 2014-10-15 DIAGNOSIS — Z23 Encounter for immunization: Secondary | ICD-10-CM

## 2014-10-22 ENCOUNTER — Telehealth: Payer: Self-pay | Admitting: *Deleted

## 2014-10-23 NOTE — Telephone Encounter (Signed)
Informed patient his appt with [facility] had been scheduled for 01/20/15 at 11:30 with Dr Luberta Robertson.

## 2014-11-18 ENCOUNTER — Telehealth: Payer: Self-pay

## 2014-11-18 NOTE — Telephone Encounter (Signed)
LVM for patient to call and confirm flu vaccine or can receive one at the office by 3/31.

## 2014-11-20 NOTE — Telephone Encounter (Signed)
Pt had one in Oct 2015 [facility] [address]  Pneumonia shot and shingles - Oct 15 2014 Dr.Daxley

## 2014-12-29 ENCOUNTER — Encounter: Payer: Self-pay | Admitting: Internal Medicine

## 2014-12-29 NOTE — Patient Instructions (Addendum)
Return in one year or as needed. Referral to urology for BPH and to dermatology regarding skin lesion.

## 2014-12-29 NOTE — Progress Notes (Signed)
Subjective:    Patient ID: Grant Griffin, male    DOB: Oct 04, 1950, 64 y.o.   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 History of impaired glucose tolerance and hyperlipidemia.  Past medical history: No history of illness requiring hospitalization. Left rotator cuff surgery 2004.  No known drug allergies.  Takes 81 mg aspirin daily.  Was seen by Dr. Wynonia Lawman  January 2011 for abnormal EKG with left axis deviation and lateral T-wave inversions dating back some years. 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 In 2004 he had a negative Cardiolite study. In 2011 he had a negative nuclear medicine stress test.  History of allergic rhinitis and Raynaud's phenomenon. Prior history of sciatica.  Colonoscopy by Dr. Sharlett Iles 10/03/2009 was within normal limits   He does have a family history of ischemic heart disease with his father having had an MI at age 89. Mother living age 15. One brother in good health. One sister in good health. One sister overweight but generally healthy.   Social history: He is married. He has a son who is a Pharmacist, community. Has a daughter who is a physical therapist and lives in [place]. He has never smoked. He consumes wine. Enjoys Civil War history in music.        Review of Systems  Constitutional: Negative.   All other systems reviewed and are negative.      Objective:   Physical Exam  Constitutional: He is oriented to person, place, and time. He appears well-developed and well-nourished. No distress.  HENT:  Head: Normocephalic and atraumatic.  Right Ear: External ear normal.  Left Ear: External ear normal.  Mouth/Throat: Oropharynx is clear and moist. No oropharyngeal exudate.  Eyes: Conjunctivae and EOM are normal. Pupils are equal, round, and reactive to light. Right eye exhibits no discharge. Left eye exhibits no discharge. No scleral icterus.  Neck: Neck supple. No JVD  present. No thyromegaly present.  Cardiovascular: Normal rate, regular rhythm, normal heart sounds and intact distal pulses.   No murmur heard. Pulmonary/Chest: Effort normal and breath sounds normal. No respiratory distress. He has no wheezes. He has no rales. He exhibits no tenderness.  Abdominal: Soft. Bowel sounds are normal. He exhibits no distension and no mass. There is no tenderness. There is no rebound and no guarding.  Genitourinary: Prostate normal.  Musculoskeletal: Normal range of motion. He exhibits no edema.  Lymphadenopathy:    He has no cervical adenopathy.  Neurological: He is alert and oriented to person, place, and time. He has normal reflexes. No cranial nerve deficit.  Skin: Skin is warm and dry. No rash noted. He is not diaphoretic.  Psychiatric: He has a normal mood and affect. His behavior is normal. Judgment and thought content normal.  Vitals reviewed.         Assessment & Plan:  History of impaired glucose tolerance-hemoglobin A1c excellent at 6%  History of Raynaud's phenomenon  History of left fascicular hemiblock.  Allergic rhinitis  History of hyperlipidemia. LDL cholesterol was 111 last year and is now within normal limits.  Plan: Return in one year or as needed.  Subjective:   Patient presents for Medicare Annual/Subsequent preventive examination.  Review Past Medical/Family/Social: See above   Risk Factors  Current exercise habits: Quite active with exercise Dietary issues discussed: Low fat low carb  Cardiac risk factors: Family history, impaired glucose tolerance  Depression Screen  (Note: if answer  to either of the following is "Yes", a more complete depression screening is indicated)   Over the past two weeks, have you felt down, depressed or hopeless? No  Over the past two weeks, have you felt little interest or pleasure in doing things? No Have you lost interest or pleasure in daily life? No Do you often feel hopeless? No Do  you cry easily over simple problems? No   Activities of Daily Living  In your present state of health, do you have any difficulty performing the following activities?:   Driving? No  Managing money? No  Feeding yourself? No  Getting from bed to chair? No  Climbing a flight of stairs? No  Preparing food and eating?: No  Bathing or showering? No  Getting dressed: No  Getting to the toilet? No  Using the toilet:No  Moving around from place to place: No  In the past year have you fallen or had a near fall?:No  Are you sexually active?  Do you have more than one partner? No   Hearing Difficulties: No  Do you often ask people to speak up or repeat themselves? No  Do you experience ringing or noises in your ears? No  Do you have difficulty understanding soft or whispered voices? No  Do you feel that you have a problem with memory? No Do you often misplace items? No    Home Safety:  Do you have a smoke alarm at your residence? Yes Do you have grab bars in the bathroom? Do you have throw rugs in your house?   Cognitive Testing  Alert? Yes Normal Appearance?Yes  Oriented to person? Yes Place? Yes  Time? Yes  Recall of three objects? Yes  Can perform simple calculations? Yes  Displays appropriate judgment?Yes  Can read the correct time from a watch face?Yes   List the Names of Other Physician/Practitioners you currently use:  See referral list for the physicians patient is currently seeing.  Has seen Dr. Wynonia Lawman in the past regarding abnormal EKG   Review of Systems: See above   Objective:     General appearance: Appears younger than stated age Head: Normocephalic, without obvious abnormality, atraumatic  Eyes: conj clear, EOMi PEERLA  Ears: normal TM's and external ear canals both ears  Nose: Nares normal. Septum midline. Mucosa normal. No drainage or sinus tenderness.  Throat: lips, mucosa, and tongue normal; teeth and gums normal  Neck: no adenopathy, no carotid  bruit, no JVD, supple, symmetrical, trachea midline and thyroid not enlarged, symmetric, no tenderness/mass/nodules  No CVA tenderness.  Lungs: clear to auscultation bilaterally  Breasts: normal appearance, no masses or tenderness Heart: regular rate and rhythm, S1, S2 normal, no murmur, click, rub or gallop  Abdomen: soft, non-tender; bowel sounds normal; no masses, no organomegaly  Musculoskeletal: ROM normal in all joints, no crepitus, no deformity, Normal muscle strengthen. Back  is symmetric, no curvature. Skin: Skin color, texture, turgor normal. No rashes or lesions  Lymph nodes: Cervical, supraclavicular, and axillary nodes normal.  Neurologic: CN 2 -12 Normal, Normal symmetric reflexes. Normal coordination and gait  Psych: Alert & Oriented x 3, Mood appear stable.    Assessment:    Annual wellness medicare exam   Plan:    During the course of the visit the patient was educated and counseled about appropriate screening and preventive services including:   Colonoscopy-done 2011  Annual PSA     Patient Instructions (the written plan) was given to the patient.  Medicare Attestation  I have personally reviewed:  The patient's medical and social history  Their use of alcohol, tobacco or illicit drugs  Their current medications and supplements  The patient's functional ability including ADLs,fall risks, home safety risks, cognitive, and hearing and visual impairment  Diet and physical activities  Evidence for depression or mood disorders  The patient's weight, height, BMI, and visual acuity have been recorded in the chart. I have made referrals, counseling, and provided education to the patient based on review of the above and I have provided the patient with a written personalized care plan for preventive services.

## 2015-03-07 DIAGNOSIS — R972 Elevated prostate specific antigen [PSA]: Secondary | ICD-10-CM | POA: Diagnosis not present

## 2015-03-11 DIAGNOSIS — R972 Elevated prostate specific antigen [PSA]: Secondary | ICD-10-CM | POA: Diagnosis not present

## 2015-06-09 DIAGNOSIS — C4441 Basal cell carcinoma of skin of scalp and neck: Secondary | ICD-10-CM | POA: Diagnosis not present

## 2015-06-09 DIAGNOSIS — D225 Melanocytic nevi of trunk: Secondary | ICD-10-CM | POA: Diagnosis not present

## 2015-06-09 DIAGNOSIS — C44519 Basal cell carcinoma of skin of other part of trunk: Secondary | ICD-10-CM | POA: Diagnosis not present

## 2015-06-09 DIAGNOSIS — C44612 Basal cell carcinoma of skin of right upper limb, including shoulder: Secondary | ICD-10-CM | POA: Diagnosis not present

## 2015-06-09 DIAGNOSIS — L814 Other melanin hyperpigmentation: Secondary | ICD-10-CM | POA: Diagnosis not present

## 2015-06-09 DIAGNOSIS — L281 Prurigo nodularis: Secondary | ICD-10-CM | POA: Diagnosis not present

## 2015-06-09 DIAGNOSIS — L570 Actinic keratosis: Secondary | ICD-10-CM | POA: Diagnosis not present

## 2015-06-09 DIAGNOSIS — Z85828 Personal history of other malignant neoplasm of skin: Secondary | ICD-10-CM | POA: Diagnosis not present

## 2015-07-14 DIAGNOSIS — Z23 Encounter for immunization: Secondary | ICD-10-CM | POA: Diagnosis not present

## 2015-07-14 DIAGNOSIS — C44519 Basal cell carcinoma of skin of other part of trunk: Secondary | ICD-10-CM | POA: Diagnosis not present

## 2015-07-14 DIAGNOSIS — Z85828 Personal history of other malignant neoplasm of skin: Secondary | ICD-10-CM | POA: Diagnosis not present

## 2015-07-29 DIAGNOSIS — C44612 Basal cell carcinoma of skin of right upper limb, including shoulder: Secondary | ICD-10-CM | POA: Diagnosis not present

## 2015-07-29 DIAGNOSIS — L570 Actinic keratosis: Secondary | ICD-10-CM | POA: Diagnosis not present

## 2015-07-29 DIAGNOSIS — Z85828 Personal history of other malignant neoplasm of skin: Secondary | ICD-10-CM | POA: Diagnosis not present

## 2015-07-29 DIAGNOSIS — C44519 Basal cell carcinoma of skin of other part of trunk: Secondary | ICD-10-CM | POA: Diagnosis not present

## 2015-11-17 DIAGNOSIS — H1013 Acute atopic conjunctivitis, bilateral: Secondary | ICD-10-CM | POA: Diagnosis not present

## 2015-12-04 ENCOUNTER — Other Ambulatory Visit: Payer: Medicare Other | Admitting: Internal Medicine

## 2015-12-04 ENCOUNTER — Other Ambulatory Visit: Payer: Self-pay | Admitting: Internal Medicine

## 2015-12-04 DIAGNOSIS — N400 Enlarged prostate without lower urinary tract symptoms: Secondary | ICD-10-CM

## 2015-12-04 DIAGNOSIS — Z0000 Encounter for general adult medical examination without abnormal findings: Secondary | ICD-10-CM

## 2015-12-04 DIAGNOSIS — Z13220 Encounter for screening for lipoid disorders: Secondary | ICD-10-CM | POA: Diagnosis not present

## 2015-12-04 DIAGNOSIS — D649 Anemia, unspecified: Secondary | ICD-10-CM | POA: Diagnosis not present

## 2015-12-04 DIAGNOSIS — R7309 Other abnormal glucose: Secondary | ICD-10-CM

## 2015-12-04 DIAGNOSIS — R7302 Impaired glucose tolerance (oral): Secondary | ICD-10-CM | POA: Diagnosis not present

## 2015-12-04 LAB — CBC WITH DIFFERENTIAL/PLATELET
BASOS ABS: 0 10*3/uL (ref 0.0–0.1)
BASOS PCT: 1 % (ref 0–1)
EOS ABS: 0.4 10*3/uL (ref 0.0–0.7)
EOS PCT: 11 % — AB (ref 0–5)
HCT: 39.8 % (ref 39.0–52.0)
Hemoglobin: 13.4 g/dL (ref 13.0–17.0)
Lymphocytes Relative: 40 % (ref 12–46)
Lymphs Abs: 1.4 10*3/uL (ref 0.7–4.0)
MCH: 28.3 pg (ref 26.0–34.0)
MCHC: 33.7 g/dL (ref 30.0–36.0)
MCV: 84.1 fL (ref 78.0–100.0)
MONOS PCT: 10 % (ref 3–12)
MPV: 9.7 fL (ref 8.6–12.4)
Monocytes Absolute: 0.4 10*3/uL (ref 0.1–1.0)
NEUTROS PCT: 38 % — AB (ref 43–77)
Neutro Abs: 1.4 10*3/uL — ABNORMAL LOW (ref 1.7–7.7)
PLATELETS: 279 10*3/uL (ref 150–400)
RBC: 4.73 MIL/uL (ref 4.22–5.81)
RDW: 14 % (ref 11.5–15.5)
WBC: 3.6 10*3/uL — ABNORMAL LOW (ref 4.0–10.5)

## 2015-12-04 LAB — COMPLETE METABOLIC PANEL WITH GFR
ALT: 18 U/L (ref 9–46)
AST: 20 U/L (ref 10–35)
Albumin: 3.9 g/dL (ref 3.6–5.1)
Alkaline Phosphatase: 35 U/L — ABNORMAL LOW (ref 40–115)
BILIRUBIN TOTAL: 0.6 mg/dL (ref 0.2–1.2)
BUN: 16 mg/dL (ref 7–25)
CHLORIDE: 102 mmol/L (ref 98–110)
CO2: 27 mmol/L (ref 20–31)
Calcium: 8.7 mg/dL (ref 8.6–10.3)
Creat: 0.82 mg/dL (ref 0.70–1.25)
GFR, Est African American: >89 mL/min (ref >=60)
GLUCOSE: 101 mg/dL — AB (ref 65–99)
Potassium: 4.7 mmol/L (ref 3.5–5.3)
SODIUM: 139 mmol/L (ref 135–146)
TOTAL PROTEIN: 6 g/dL — AB (ref 6.1–8.1)

## 2015-12-04 LAB — LIPID PANEL
CHOL/HDL RATIO: 2 ratio (ref <=5.0)
CHOLESTEROL: 182 mg/dL (ref 125–200)
HDL: 91 mg/dL (ref >=40)
LDL CALC: 82 mg/dL (ref <130)
TRIGLYCERIDES: 46 mg/dL (ref <150)
VLDL: 9 mg/dL (ref <30)

## 2015-12-05 LAB — HEMOGLOBIN A1C
Hgb A1c MFr Bld: 6 % — ABNORMAL HIGH (ref <5.7)
Mean Plasma Glucose: 126 mg/dL

## 2015-12-05 LAB — PSA: PSA: 2.22 ng/mL (ref <=4.00)

## 2015-12-08 ENCOUNTER — Encounter: Payer: Medicare Other | Admitting: Internal Medicine

## 2015-12-08 LAB — FOLATE: Folate: 8.1 ng/mL (ref >5.4)

## 2015-12-08 LAB — VITAMIN B12: Vitamin B-12: 399 pg/mL (ref 200–1100)

## 2015-12-08 LAB — IRON AND TIBC
%SAT: 37 % (ref 15–60)
IRON: 99 ug/dL (ref 50–180)
TIBC: 271 ug/dL (ref 250–425)
UIBC: 172 ug/dL (ref 125–400)

## 2015-12-23 ENCOUNTER — Ambulatory Visit (INDEPENDENT_AMBULATORY_CARE_PROVIDER_SITE_OTHER): Payer: Medicare Other | Admitting: Internal Medicine

## 2015-12-23 ENCOUNTER — Encounter: Payer: Self-pay | Admitting: Internal Medicine

## 2015-12-23 VITALS — BP 128/78 | HR 72 | Temp 97.3°F | Resp 20 | Ht 68.75 in | Wt 188.0 lb

## 2015-12-23 DIAGNOSIS — R7302 Impaired glucose tolerance (oral): Secondary | ICD-10-CM

## 2015-12-23 DIAGNOSIS — Z23 Encounter for immunization: Secondary | ICD-10-CM

## 2015-12-23 DIAGNOSIS — Z0000 Encounter for general adult medical examination without abnormal findings: Secondary | ICD-10-CM

## 2015-12-23 LAB — POCT URINALYSIS DIPSTICK
Bilirubin, UA: NEGATIVE
Glucose, UA: NEGATIVE
Ketones, UA: NEGATIVE
Leukocytes, UA: NEGATIVE
Nitrite, UA: NEGATIVE
PH UA: 6
RBC UA: NEGATIVE
UROBILINOGEN UA: 0.2

## 2015-12-23 NOTE — Patient Instructions (Addendum)
Pneumovax 23 given today. It  was pleasure to see you today. Continue diet and exercise and return in one year or as needed. Keep appointment with urologist.

## 2016-01-03 NOTE — Progress Notes (Signed)
Subjective:    Patient ID: Grant Griffin, male    DOB: 08-29-1951, 65 y.o.   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 His general health is excellent. He does have history of impaired glucose tolerance which is treated with diet alone. History of mild hyperlipidemia.  No known drug allergies  Takes aspirin 81 mg daily  Past medical history: No history of illness requiring hospitalization. Left rotator cuff surgery 2004.  Was seen by Dr. Wynonia Lawman January 2011 for abnormal EKG with left axis deviation and lateral T-wave inversions dating back some years. 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 In 2004 he had a negative Cardiolite study. In 2011 had a negative nuclear medicine stress test.  History of allergic rhinitis and Raynaud's phenomenon. Prior history of sciatica.  Colonoscopy by Dr. Sharlett Iles 10/03/2009 was within normal limits.  Social history: He is married. He has a son who is a Pharmacist, community. Has a daughter who is a physical therapist and lives in [place]. He has never smoked. He consumes wine. Enjoys Civil War history and music.  Family history: Family history of ischemic heart disease with father having had an MI at age 79. Mother living age 39. One brother in good health. One sister in good health. One sister overweight but generally healthy.    Review of Systems  Constitutional: Negative.   All other systems reviewed and are negative.      Objective:   Physical Exam  Constitutional: He is oriented to person, place, and time. He appears well-developed and well-nourished.  HENT:  Head: Normocephalic and atraumatic.  Right Ear: External ear normal.  Left Ear: External ear normal.  Eyes: Conjunctivae and EOM are normal. Pupils are equal, round, and reactive to light. Right eye exhibits no discharge. Left eye exhibits no discharge. No scleral icterus.  Neck: Neck  supple. No JVD present. No thyromegaly present.  Cardiovascular: Normal rate, regular rhythm, normal heart sounds and intact distal pulses.   No murmur heard. Pulmonary/Chest: Effort normal and breath sounds normal. No respiratory distress. He has no wheezes. He has no rales.  Abdominal: Soft. Bowel sounds are normal. He exhibits no distension and no mass. There is no tenderness. There is no rebound and no guarding.  Genitourinary:  Prostate exam deferred to urologist. PSA is normal.  Musculoskeletal: He exhibits no edema.  Lymphadenopathy:    He has no cervical adenopathy.  Neurological: He is alert and oriented to person, place, and time. He has normal reflexes. No cranial nerve deficit. Coordination normal.  Skin: Skin is warm and dry. No rash noted.  Psychiatric: He has a normal mood and affect. His behavior is normal. Judgment and thought content normal.  Vitals reviewed.         Assessment & Plan:  Impaired glucose tolerance. Him globin A1c is 6%. Continue diet and exercise efforts.  History of hyperlipidemia-lipid panel is normal  Plan: Return in one year or is needed. Continue diet and exercise. Pneumococcal 23 vaccine given.  Subjective:   Patient presents for Medicare Annual/Subsequent preventive examination.  Review Past Medical/Family/Social:See above   Risk Factors  Current exercise habits: Exercises regularly Dietary issues discussed: Low fat low carbohydrate  Cardiac risk factors:Impaired glucose tolerance, history  Depression Screen  (Note: if answer to either of the following is "Yes", a more complete depression screening is indicated)   Over the past two weeks, have you felt down,  depressed or hopeless? No  Over the past two weeks, have you felt little interest or pleasure in doing things? No Have you lost interest or pleasure in daily life? No Do you often feel hopeless? No Do you cry easily over simple problems? No   Activities of Daily Living  In  your present state of health, do you have any difficulty performing the following activities?:   Driving? No  Managing money? No  Feeding yourself? No  Getting from bed to chair? No  Climbing a flight of stairs? No  Preparing food and eating?: No  Bathing or showering? No  Getting dressed: No  Getting to the toilet? No  Using the toilet:No  Moving around from place to place: No  In the past year have you fallen or had a near fall?:No  Are you sexually active? yes Do you have more than one partner? No   Hearing Difficulties: No  Do you often ask people to speak up or repeat themselves? No  Do you experience ringing or noises in your ears? No  Do you have difficulty understanding soft or whispered voices? No  Do you feel that you have a problem with memory? No Do you often misplace items? No    Home Safety:  Do you have a smoke alarm at your residence? Yes Do you have grab bars in the bathroom?No Do you have throw rugs in your house? Yes   Cognitive Testing  Alert? Yes Normal Appearance?Yes  Oriented to person? Yes Place? Yes  Time? Yes  Recall of three objects? Yes  Can perform simple calculations? Yes  Displays appropriate judgment?Yes  Can read the correct time from a watch face?Yes   List the Names of Other Physician/Practitioners you currently use:  See referral list for the physicians patient is currently seeing.     Review of Systems: Noncontributory   Objective:     General appearance: Appears Younger than stated age Head: Normocephalic, without obvious abnormality, atraumatic  Eyes: conj clear, EOMi PEERLA  Ears: normal TM's and external ear canals both ears  Nose: Nares normal. Septum midline. Mucosa normal. No drainage or sinus tenderness.  Throat: lips, mucosa, and tongue normal; teeth and gums normal  Neck: no adenopathy, no carotid bruit, no JVD, supple, symmetrical, trachea midline and thyroid not enlarged, symmetric, no tenderness/mass/nodules    No CVA tenderness.  Lungs: clear to auscultation bilaterally  Breasts: normal appearance, no masses or tenderness Heart: regular rate and rhythm, S1, S2 normal, no murmur, click, rub or gallop  Abdomen: soft, non-tender; bowel sounds normal; no masses, no organomegaly  Musculoskeletal: ROM normal in all joints, no crepitus, no deformity, Normal muscle strengthen. Back  is symmetric, no curvature. Skin: Skin color, texture, turgor normal. No rashes or lesions  Lymph nodes: Cervical, supraclavicular, and axillary nodes normal.  Neurologic: CN 2 -12 Normal, Normal symmetric reflexes. Normal coordination and gait  Psych: Alert & Oriented x 3, Mood appear stable.    Assessment:    Annual wellness medicare exam   Plan:    During the course of the visit the patient was educated and counseled about appropriate screening and preventive services including:   Annual PSA-done with this exam is within normal limits.  Pneumococcal 23 given today     Patient Instructions (the written plan) was given to the patient.  Medicare Attestation  I have personally reviewed:  The patient's medical and social history  Their use of alcohol, tobacco or illicit drugs  Their current  medications and supplements  The patient's functional ability including ADLs,fall risks, home safety risks, cognitive, and hearing and visual impairment  Diet and physical activities  Evidence for depression or mood disorders  The patient's weight, height, BMI, and visual acuity have been recorded in the chart. I have made referrals, counseling, and provided education to the patient based on review of the above and I have provided the patient with a written personalized care plan for preventive services.

## 2016-04-07 DIAGNOSIS — R972 Elevated prostate specific antigen [PSA]: Secondary | ICD-10-CM | POA: Diagnosis not present

## 2016-07-09 DIAGNOSIS — Z23 Encounter for immunization: Secondary | ICD-10-CM | POA: Diagnosis not present

## 2016-08-02 ENCOUNTER — Ambulatory Visit (INDEPENDENT_AMBULATORY_CARE_PROVIDER_SITE_OTHER): Payer: Medicare Other | Admitting: Internal Medicine

## 2016-08-02 ENCOUNTER — Encounter: Payer: Self-pay | Admitting: Internal Medicine

## 2016-08-02 VITALS — BP 120/82 | HR 74 | Temp 97.7°F | Ht 69.0 in | Wt 190.0 lb

## 2016-08-02 DIAGNOSIS — J209 Acute bronchitis, unspecified: Secondary | ICD-10-CM

## 2016-08-02 DIAGNOSIS — R7302 Impaired glucose tolerance (oral): Secondary | ICD-10-CM | POA: Diagnosis not present

## 2016-08-02 MED ORDER — LEVOFLOXACIN 500 MG PO TABS: 500.0000 mg | ORAL_TABLET | Freq: Every day | ORAL | 0 refills | Status: DC

## 2016-08-02 NOTE — Patient Instructions (Signed)
Levaquin 500 milligrams daily for 10 days. Rest and drink plenty of fluids. Call if not better in 10 days or sooner if worse. May take over-the-counter cough syrup as needed for cough.

## 2016-08-02 NOTE — Progress Notes (Signed)
   Subjective:    Patient ID: Grant Griffin, male    DOB: [birth year], 65 y.o.   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 He thought he was getting better prior to leaving for [place]. Now since returning home, has cough and congestion. Has sputum production that is discolored. Says he has no shortness of breath. No documented fever.  History of impaired glucose tolerance diet controlled    Review of Systems see above     Objective:   Physical Exam Has congested cough. Pharynx is very slightly injected. TMs slightly full bilaterally. Neck is supple without adenopathy. Chest clear to auscultation       Assessment & Plan:  Acute bronchitis  Plan: Levaquin 500 milligrams daily for 10 days. Call if not better in 10 days or sooner if worse.

## 2016-09-28 DIAGNOSIS — S7002XA Contusion of left hip, initial encounter: Secondary | ICD-10-CM | POA: Diagnosis not present

## 2016-09-28 DIAGNOSIS — S40012A Contusion of left shoulder, initial encounter: Secondary | ICD-10-CM | POA: Diagnosis not present

## 2016-12-23 DIAGNOSIS — H1851 Endothelial corneal dystrophy: Secondary | ICD-10-CM | POA: Diagnosis not present

## 2017-07-20 ENCOUNTER — Telehealth: Payer: Self-pay

## 2017-07-20 NOTE — Telephone Encounter (Signed)
LVM for pt to call back and make CPE since his last was 12/2015.

## 2017-08-18 ENCOUNTER — Other Ambulatory Visit: Payer: Self-pay | Admitting: Internal Medicine

## 2017-08-18 DIAGNOSIS — Z0000 Encounter for general adult medical examination without abnormal findings: Secondary | ICD-10-CM

## 2017-08-18 DIAGNOSIS — Z13220 Encounter for screening for lipoid disorders: Secondary | ICD-10-CM

## 2017-08-18 DIAGNOSIS — Z125 Encounter for screening for malignant neoplasm of prostate: Secondary | ICD-10-CM

## 2017-08-18 DIAGNOSIS — R7302 Impaired glucose tolerance (oral): Secondary | ICD-10-CM

## 2017-08-25 ENCOUNTER — Other Ambulatory Visit (INDEPENDENT_AMBULATORY_CARE_PROVIDER_SITE_OTHER): Payer: PPO | Admitting: Internal Medicine

## 2017-08-25 DIAGNOSIS — Z0000 Encounter for general adult medical examination without abnormal findings: Secondary | ICD-10-CM

## 2017-08-25 DIAGNOSIS — Z125 Encounter for screening for malignant neoplasm of prostate: Secondary | ICD-10-CM

## 2017-08-25 DIAGNOSIS — R7302 Impaired glucose tolerance (oral): Secondary | ICD-10-CM | POA: Diagnosis not present

## 2017-08-25 DIAGNOSIS — Z13220 Encounter for screening for lipoid disorders: Secondary | ICD-10-CM

## 2017-08-26 LAB — LIPID PANEL
CHOL/HDL RATIO: 1.9 (calc) (ref <5.0)
Cholesterol: 199 mg/dL (ref <200)
HDL: 103 mg/dL (ref >40)
LDL Cholesterol (Calc): 83 mg/dL (calc)
Non-HDL Cholesterol (Calc): 96 mg/dL (calc) (ref <130)
Triglycerides: 46 mg/dL (ref <150)

## 2017-08-26 LAB — CBC WITH DIFFERENTIAL/PLATELET
BASOS PCT: 0.6 %
Basophils Absolute: 21 cells/uL (ref 0–200)
Eosinophils Absolute: 221 cells/uL (ref 15–500)
Eosinophils Relative: 6.3 %
HCT: 41.7 % (ref 38.5–50.0)
HEMOGLOBIN: 14.4 g/dL (ref 13.2–17.1)
Lymphs Abs: 1239 cells/uL (ref 850–3900)
MCH: 29.6 pg (ref 27.0–33.0)
MCHC: 34.5 g/dL (ref 32.0–36.0)
MCV: 85.6 fL (ref 80.0–100.0)
MONOS PCT: 10 %
MPV: 10.2 fL (ref 7.5–12.5)
NEUTROS ABS: 1670 {cells}/uL (ref 1500–7800)
Neutrophils Relative %: 47.7 %
PLATELETS: 269 10*3/uL (ref 140–400)
RBC: 4.87 10*6/uL (ref 4.20–5.80)
RDW: 12.7 % (ref 11.0–15.0)
TOTAL LYMPHOCYTE: 35.4 %
WBC mixed population: 350 cells/uL (ref 200–950)
WBC: 3.5 10*3/uL — AB (ref 3.8–10.8)

## 2017-08-26 LAB — PSA: PSA: 3.8 ng/mL (ref <=4.0)

## 2017-08-26 LAB — COMPLETE METABOLIC PANEL WITH GFR
AG Ratio: 1.9 (calc) (ref 1.0–2.5)
ALKALINE PHOSPHATASE (APISO): 42 U/L (ref 40–115)
ALT: 18 U/L (ref 9–46)
AST: 22 U/L (ref 10–35)
Albumin: 4.1 g/dL (ref 3.6–5.1)
BUN: 20 mg/dL (ref 7–25)
CALCIUM: 9.5 mg/dL (ref 8.6–10.3)
CO2: 29 mmol/L (ref 20–32)
CREATININE: 0.85 mg/dL (ref 0.70–1.25)
Chloride: 104 mmol/L (ref 98–110)
GFR, Est African American: 104 mL/min/{1.73_m2} (ref >=60)
GFR, Est Non African American: 90 mL/min/{1.73_m2} (ref >=60)
GLOBULIN: 2.2 g/dL (ref 1.9–3.7)
GLUCOSE: 92 mg/dL (ref 65–99)
Potassium: 5.2 mmol/L (ref 3.5–5.3)
SODIUM: 141 mmol/L (ref 135–146)
Total Bilirubin: 0.6 mg/dL (ref 0.2–1.2)
Total Protein: 6.3 g/dL (ref 6.1–8.1)

## 2017-08-26 LAB — HEMOGLOBIN A1C
HEMOGLOBIN A1C: 5.9 %{Hb} — AB (ref <5.7)
MEAN PLASMA GLUCOSE: 123 (calc)
eAG (mmol/L): 6.8 (calc)

## 2017-09-02 ENCOUNTER — Ambulatory Visit (INDEPENDENT_AMBULATORY_CARE_PROVIDER_SITE_OTHER): Payer: PPO | Admitting: Internal Medicine

## 2017-09-02 ENCOUNTER — Encounter: Payer: Self-pay | Admitting: Internal Medicine

## 2017-09-02 VITALS — Ht 67.0 in | Wt 188.0 lb

## 2017-09-02 DIAGNOSIS — Z0000 Encounter for general adult medical examination without abnormal findings: Secondary | ICD-10-CM

## 2017-09-02 DIAGNOSIS — R7302 Impaired glucose tolerance (oral): Secondary | ICD-10-CM | POA: Diagnosis not present

## 2017-09-02 LAB — POCT URINALYSIS DIPSTICK
Appearance: NEGATIVE
BILIRUBIN UA: NEGATIVE
Glucose, UA: NEGATIVE
KETONES UA: NEGATIVE
Leukocytes, UA: NEGATIVE
NITRITE UA: NEGATIVE
ODOR: NORMAL
PH UA: 6 (ref 5.0–8.0)
PROTEIN UA: NEGATIVE
RBC UA: NEGATIVE
SPEC GRAV UA: 1.015 (ref 1.010–1.025)
UROBILINOGEN UA: 0.2 U/dL

## 2017-09-02 NOTE — Progress Notes (Signed)
Subjective:    Patient ID: Grant Griffin, male    DOB: 1950/11/22, 66 y.o.   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  General health is excellent.  He plays golf at least weekly.  Watches his diet.  Generally eats fruit for breakfast with yogurt.  No known drug allergies.  Past medical history: No history of serious illnesses requiring hospitalization.  Left rotator cuff surgery 2004.  Was seen by Dr. Wynonia Lawman January 2011 for abnormal EKG with left axis deviation and lateral T wave inversions dating back some years.  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  He had a negative Cardiolite study in 2004.  In 2011 he had a negative nuclear medicine stress test.  Prior history of sciatica.  History of allergic rhinitis and right nodes phenomenon.  Colonoscopy by Dr. Sharlett Iles 2011 was normal.  Social history: He is married.  He has a son who is a Pharmacist, community who just purchased a practice here and is moving from [place] with a new baby and wife who is an Arboriculturist.  Patient has a daughter who is a physical therapist and lives in [place].  He has never smoked.  Enjoys Civil [employer] history of music.  He consumes wine.  Family history: Father with history of ischemic heart disease with father having had MI at age 28.  Mother living in her late 45s.  One brother in good health.  One sister in good health.  One sister overweight but generally healthy.  Sees Dr. Diona Fanti about every 2 years for prostate exam.  No significant nocturia.    Review of Systems  Respiratory: Negative.   Cardiovascular: Negative.   Gastrointestinal: Negative.   Genitourinary: Negative.   Musculoskeletal:       Some mild musculoskeletal pain after playing golf for which he takes anti-inflammatory medication  Psychiatric/Behavioral: Negative.        Objective:   Physical Exam    Constitutional: He is oriented to person, place, and time. He appears well-developed and well-nourished. No distress.  HENT:  Head: Normocephalic and atraumatic.  Right Ear: External ear normal.  Left Ear: External ear normal.  Mouth/Throat: Oropharynx is clear and moist.  Eyes: Conjunctivae and EOM are normal. Pupils are equal, round, and reactive to light. Right eye exhibits no discharge. Left eye exhibits no discharge. No scleral icterus.  Neck: Neck supple. No JVD present. No thyromegaly present.  Cardiovascular: Normal rate, regular rhythm and normal heart sounds.  No murmur heard. Pulmonary/Chest: Effort normal and breath sounds normal. No respiratory distress. He has no wheezes. He has no rales.  Abdominal: Soft. Bowel sounds are normal. He exhibits no distension and no mass. There is no tenderness. There is no rebound and no guarding.  Genitourinary: Prostate normal.  Musculoskeletal: He exhibits no edema.  Lymphadenopathy:    He has no cervical adenopathy.  Neurological: He is oriented to person, place, and time. He has normal reflexes. No cranial nerve deficit. Coordination normal.  Skin: Skin is warm and dry. No rash noted. He is not diaphoretic.  Psychiatric: He has a normal mood and affect. His behavior is normal. Thought content normal.  Vitals reviewed.         Assessment & Plan:   Impaired glucose tolerance.  Hemoglobin A1c 5.9% in 1 year ago 6%.  Refer to dietitian.  Can recheck in 6 months.  Health maintenance-order  given for Shingrix vaccine  Subjective:   Patient presents for Medicare Annual/Subsequent preventive examination.  Review Past Medical/Family/Social: See above   Risk Factors  Current exercise habits: Exercises regularly and plays golf Dietary issues discussed: Low-fat low carbohydrate  Cardiac risk factors: Impaired glucose tolerance, family history and follow-up  Depression Screen  (Note: if answer to either of the following is "Yes", a  more complete depression screening is indicated)   Over the past two weeks, have you felt down, depressed or hopeless? No  Over the past two weeks, have you felt little interest or pleasure in doing things? No Have you lost interest or pleasure in daily life? No Do you often feel hopeless? No Do you cry easily over simple problems? No   Activities of Daily Living  In your present state of health, do you have any difficulty performing the following activities?:   Driving? No  Managing money? No  Feeding yourself? No  Getting from bed to chair? No  Climbing a flight of stairs? No  Preparing food and eating?: No  Bathing or showering? No  Getting dressed: No  Getting to the toilet? No  Using the toilet:No  Moving around from place to place: No  In the past year have you fallen or had a near fall?:No  Are you sexually active? yes Do you have more than one partner? No   Hearing Difficulties: No  Do you often ask people to speak up or repeat themselves? No  Do you experience ringing or noises in your ears? No  Do you have difficulty understanding soft or whispered voices? No  Do you feel that you have a problem with memory? No Do you often misplace items? No    Home Safety:  Do you have a smoke alarm at your residence? Yes Do you have grab bars in the bathroom?  No Do you have throw rugs in your house?  No   Cognitive Testing  Alert? Yes Normal Appearance?Yes  Oriented to person? Yes Place? Yes  Time? Yes  Recall of three objects? Yes  Can perform simple calculations? Yes  Displays appropriate judgment?Yes  Can read the correct time from a watch face?Yes   List the Names of Other Physician/Practitioners you currently use:  See referral list for the physicians patient is currently seeing.     Review of Systems: See above this is currently   Objective:     General appearance: Appears  younger than stated age Head: Normocephalic, without obvious abnormality,  atraumatic  Eyes: conj clear, EOMi PEERLA  Ears: normal TM's and external ear canals both ears  Nose: Nares normal. Septum midline. Mucosa normal. No drainage or sinus tenderness.  Throat: lips, mucosa, and tongue normal; teeth and gums normal  Neck: no adenopathy, no carotid bruit, no JVD, supple, symmetrical, trachea midline and thyroid not enlarged, symmetric, no tenderness/mass/nodules  No CVA tenderness.  Lungs: clear to auscultation bilaterally  Breasts: normal appearance, no masses or tenderness Heart: regular rate and rhythm, S1, S2 normal, no murmur, click, rub or gallop  Abdomen: soft, non-tender; bowel sounds normal; no masses, no organomegaly  Musculoskeletal: ROM normal in all joints, no crepitus, no deformity, Normal muscle strengthen. Back  is symmetric, no curvature. Skin: Skin color, texture, turgor normal. No rashes or lesions  Lymph nodes: Cervical, supraclavicular, and axillary nodes normal.  Neurologic: CN 2 -12 Normal, Normal symmetric reflexes. Normal coordination and gait  Psych: Alert & Oriented x 3, Mood appear stable.  Assessment:    Annual wellness medicare exam   Plan:    During the course of the visit the patient was educated and counseled about appropriate screening and preventive services including:  Annual flu vaccine  Order given for Shingrix vaccine      Patient Instructions (the written plan) was given to the patient.  Medicare Attestation  I have personally reviewed:  The patient's medical and social history  Their use of alcohol, tobacco or illicit drugs  Their current medications and supplements  The patient's functional ability including ADLs,fall risks, home safety risks, cognitive, and hearing and visual impairment  Diet and physical activities  Evidence for depression or mood disorders  The patient's weight, height, BMI, and visual acuity have been recorded in the chart. I have made referrals, counseling, and provided education to  the patient based on review of the above and I have provided the patient with a written personalized care plan for preventive services.

## 2017-09-02 NOTE — Patient Instructions (Addendum)
It was a pleasure to see you today.  Appointment will be made with dietitian for dietary counseling for impaired glucose tolerance.  Return in 6 months.  Order given for shingles vaccine.

## 2018-01-31 ENCOUNTER — Other Ambulatory Visit: Payer: Self-pay | Admitting: Internal Medicine

## 2018-01-31 DIAGNOSIS — H1851 Endothelial corneal dystrophy: Secondary | ICD-10-CM | POA: Diagnosis not present

## 2018-01-31 DIAGNOSIS — R7302 Impaired glucose tolerance (oral): Secondary | ICD-10-CM

## 2018-02-10 ENCOUNTER — Encounter: Payer: Self-pay | Admitting: Internal Medicine

## 2018-02-10 ENCOUNTER — Other Ambulatory Visit: Payer: PPO | Admitting: Internal Medicine

## 2018-02-10 DIAGNOSIS — R7302 Impaired glucose tolerance (oral): Secondary | ICD-10-CM

## 2018-02-10 DIAGNOSIS — L281 Prurigo nodularis: Secondary | ICD-10-CM | POA: Diagnosis not present

## 2018-02-10 DIAGNOSIS — C44519 Basal cell carcinoma of skin of other part of trunk: Secondary | ICD-10-CM | POA: Diagnosis not present

## 2018-02-10 DIAGNOSIS — L821 Other seborrheic keratosis: Secondary | ICD-10-CM | POA: Diagnosis not present

## 2018-02-10 DIAGNOSIS — D225 Melanocytic nevi of trunk: Secondary | ICD-10-CM | POA: Diagnosis not present

## 2018-02-10 DIAGNOSIS — C44319 Basal cell carcinoma of skin of other parts of face: Secondary | ICD-10-CM | POA: Diagnosis not present

## 2018-02-10 DIAGNOSIS — Z85828 Personal history of other malignant neoplasm of skin: Secondary | ICD-10-CM | POA: Diagnosis not present

## 2018-02-10 DIAGNOSIS — D485 Neoplasm of uncertain behavior of skin: Secondary | ICD-10-CM | POA: Diagnosis not present

## 2018-02-10 DIAGNOSIS — L570 Actinic keratosis: Secondary | ICD-10-CM | POA: Diagnosis not present

## 2018-02-10 DIAGNOSIS — L578 Other skin changes due to chronic exposure to nonionizing radiation: Secondary | ICD-10-CM | POA: Diagnosis not present

## 2018-02-11 LAB — HEMOGLOBIN A1C
EAG (MMOL/L): 6.5 (calc)
Hgb A1c MFr Bld: 5.7 % of total Hgb — ABNORMAL HIGH (ref <5.7)
MEAN PLASMA GLUCOSE: 117 (calc)

## 2018-03-03 ENCOUNTER — Ambulatory Visit (INDEPENDENT_AMBULATORY_CARE_PROVIDER_SITE_OTHER): Payer: PPO | Admitting: Internal Medicine

## 2018-03-03 ENCOUNTER — Encounter: Payer: Self-pay | Admitting: Internal Medicine

## 2018-03-03 ENCOUNTER — Ambulatory Visit: Payer: PPO | Admitting: Internal Medicine

## 2018-03-03 VITALS — BP 110/70 | HR 74 | Temp 98.2°F | Ht 67.0 in | Wt 188.0 lb

## 2018-03-03 DIAGNOSIS — R7302 Impaired glucose tolerance (oral): Secondary | ICD-10-CM

## 2018-03-03 NOTE — Progress Notes (Signed)
   Subjective:    Patient ID: Grant Griffin, male    DOB: 12/03/1950, 67 y.o.   MRN: [id number]  HPI 67 year old Male for follow up of impaired glucose tolerance. Hgb AIC was 5.9% in December and 6% when taken 2 years ago.  He has been watching his diet he exercises a great deal.  He says he would like to lose a little bit of weight.    Review of Systems     Objective:   Physical Exam  Not examined but spent 15 minutes speaking with him today about implications of impaired glucose tolerance.  He is made progress and it is now 5.7% down from 5.9% December 2018.  Discussed low-dose metformin but he does not want to pursue that at this point in time.  We will reevaluate at time of his physical exam December 2019.      Assessment & Plan:  Impaired glucose tolerance-currently diet controlled and improved from 5.9% in December to 5.7%.  Continue to work on diet exercise and weight loss.  He does not want to be on metformin at the present time.  Plan: Return December 2019.  He will continue to work on diet exercise and weight loss.

## 2018-03-03 NOTE — Patient Instructions (Signed)
Patient will continue to work on diet exercise and weight loss.  Improvement noted in impaired glucose tolerance

## 2018-04-26 DIAGNOSIS — C44319 Basal cell carcinoma of skin of other parts of face: Secondary | ICD-10-CM | POA: Diagnosis not present

## 2018-04-26 DIAGNOSIS — Z85828 Personal history of other malignant neoplasm of skin: Secondary | ICD-10-CM | POA: Diagnosis not present

## 2018-05-03 DIAGNOSIS — C44519 Basal cell carcinoma of skin of other part of trunk: Secondary | ICD-10-CM | POA: Diagnosis not present

## 2018-05-03 DIAGNOSIS — Z85828 Personal history of other malignant neoplasm of skin: Secondary | ICD-10-CM | POA: Diagnosis not present

## 2018-05-24 DIAGNOSIS — R972 Elevated prostate specific antigen [PSA]: Secondary | ICD-10-CM | POA: Diagnosis not present

## 2018-06-23 ENCOUNTER — Encounter: Payer: Self-pay | Admitting: Internal Medicine

## 2018-06-23 DIAGNOSIS — C44519 Basal cell carcinoma of skin of other part of trunk: Secondary | ICD-10-CM | POA: Diagnosis not present

## 2018-06-23 DIAGNOSIS — L821 Other seborrheic keratosis: Secondary | ICD-10-CM | POA: Diagnosis not present

## 2018-06-23 DIAGNOSIS — Z85828 Personal history of other malignant neoplasm of skin: Secondary | ICD-10-CM | POA: Diagnosis not present

## 2018-06-23 DIAGNOSIS — L739 Follicular disorder, unspecified: Secondary | ICD-10-CM | POA: Diagnosis not present

## 2018-06-23 DIAGNOSIS — D2261 Melanocytic nevi of right upper limb, including shoulder: Secondary | ICD-10-CM | POA: Diagnosis not present

## 2018-06-23 DIAGNOSIS — L814 Other melanin hyperpigmentation: Secondary | ICD-10-CM | POA: Diagnosis not present

## 2018-06-23 DIAGNOSIS — L570 Actinic keratosis: Secondary | ICD-10-CM | POA: Diagnosis not present

## 2018-06-23 DIAGNOSIS — D485 Neoplasm of uncertain behavior of skin: Secondary | ICD-10-CM | POA: Diagnosis not present

## 2018-06-23 DIAGNOSIS — D225 Melanocytic nevi of trunk: Secondary | ICD-10-CM | POA: Diagnosis not present

## 2018-07-06 DIAGNOSIS — Z85828 Personal history of other malignant neoplasm of skin: Secondary | ICD-10-CM | POA: Diagnosis not present

## 2018-07-06 DIAGNOSIS — C44519 Basal cell carcinoma of skin of other part of trunk: Secondary | ICD-10-CM | POA: Diagnosis not present

## 2018-09-01 ENCOUNTER — Encounter: Payer: Self-pay | Admitting: Internal Medicine

## 2018-09-01 ENCOUNTER — Telehealth: Payer: Self-pay | Admitting: Internal Medicine

## 2018-09-01 NOTE — Telephone Encounter (Signed)
Pt called to cancel CPE appt for January as he will be out of town.

## 2018-09-12 ENCOUNTER — Other Ambulatory Visit: Payer: PPO | Admitting: Internal Medicine

## 2018-09-14 ENCOUNTER — Encounter: Payer: PPO | Admitting: Internal Medicine

## 2018-12-20 ENCOUNTER — Telehealth: Payer: Self-pay | Admitting: Internal Medicine

## 2018-12-20 NOTE — Telephone Encounter (Signed)
LVM to CB and reschedule CPE for June or July

## 2019-03-22 DIAGNOSIS — R972 Elevated prostate specific antigen [PSA]: Secondary | ICD-10-CM | POA: Diagnosis not present

## 2019-05-09 DIAGNOSIS — R351 Nocturia: Secondary | ICD-10-CM | POA: Diagnosis not present

## 2019-05-09 DIAGNOSIS — R972 Elevated prostate specific antigen [PSA]: Secondary | ICD-10-CM | POA: Diagnosis not present

## 2019-05-10 DIAGNOSIS — H1851 Endothelial corneal dystrophy: Secondary | ICD-10-CM | POA: Diagnosis not present

## 2019-05-10 DIAGNOSIS — H401431 Capsular glaucoma with pseudoexfoliation of lens, bilateral, mild stage: Secondary | ICD-10-CM | POA: Diagnosis not present

## 2019-05-10 DIAGNOSIS — Z961 Presence of intraocular lens: Secondary | ICD-10-CM | POA: Diagnosis not present

## 2019-05-31 ENCOUNTER — Other Ambulatory Visit: Payer: Self-pay

## 2019-05-31 DIAGNOSIS — Z20822 Contact with and (suspected) exposure to covid-19: Secondary | ICD-10-CM

## 2019-06-01 LAB — NOVEL CORONAVIRUS, NAA: SARS-CoV-2, NAA: NOT DETECTED

## 2019-06-19 ENCOUNTER — Encounter: Payer: Self-pay | Admitting: Internal Medicine

## 2019-06-19 NOTE — Telephone Encounter (Addendum)
Called patient to schedule CPE, the phone rang twice then immediatly went to a busy signal. So I mailed letter to call office to schedule Physical.

## 2019-07-09 NOTE — Telephone Encounter (Signed)
Patient called back and scheduled appointment

## 2019-07-19 DIAGNOSIS — L814 Other melanin hyperpigmentation: Secondary | ICD-10-CM | POA: Diagnosis not present

## 2019-07-19 DIAGNOSIS — D485 Neoplasm of uncertain behavior of skin: Secondary | ICD-10-CM | POA: Diagnosis not present

## 2019-07-19 DIAGNOSIS — D225 Melanocytic nevi of trunk: Secondary | ICD-10-CM | POA: Diagnosis not present

## 2019-07-19 DIAGNOSIS — L570 Actinic keratosis: Secondary | ICD-10-CM | POA: Diagnosis not present

## 2019-07-19 DIAGNOSIS — C4442 Squamous cell carcinoma of skin of scalp and neck: Secondary | ICD-10-CM | POA: Diagnosis not present

## 2019-07-19 DIAGNOSIS — Z85828 Personal history of other malignant neoplasm of skin: Secondary | ICD-10-CM | POA: Diagnosis not present

## 2019-07-19 DIAGNOSIS — L821 Other seborrheic keratosis: Secondary | ICD-10-CM | POA: Diagnosis not present

## 2019-07-26 DIAGNOSIS — L570 Actinic keratosis: Secondary | ICD-10-CM | POA: Diagnosis not present

## 2019-07-26 DIAGNOSIS — Z85828 Personal history of other malignant neoplasm of skin: Secondary | ICD-10-CM | POA: Diagnosis not present

## 2019-07-26 DIAGNOSIS — C4442 Squamous cell carcinoma of skin of scalp and neck: Secondary | ICD-10-CM | POA: Diagnosis not present

## 2019-10-01 DIAGNOSIS — Z20822 Contact with and (suspected) exposure to covid-19: Secondary | ICD-10-CM | POA: Diagnosis not present

## 2019-10-16 ENCOUNTER — Other Ambulatory Visit: Payer: Self-pay

## 2019-10-16 ENCOUNTER — Ambulatory Visit (AMBULATORY_SURGERY_CENTER): Payer: Self-pay | Admitting: *Deleted

## 2019-10-16 VITALS — Temp 98.0°F | Ht 68.0 in | Wt 185.0 lb

## 2019-10-16 DIAGNOSIS — Z1211 Encounter for screening for malignant neoplasm of colon: Secondary | ICD-10-CM

## 2019-10-16 NOTE — Progress Notes (Signed)

## 2019-10-17 ENCOUNTER — Encounter: Payer: Self-pay | Admitting: Gastroenterology

## 2019-10-25 ENCOUNTER — Other Ambulatory Visit: Payer: PPO | Admitting: Internal Medicine

## 2019-10-29 ENCOUNTER — Ambulatory Visit (INDEPENDENT_AMBULATORY_CARE_PROVIDER_SITE_OTHER): Payer: PPO | Admitting: Internal Medicine

## 2019-10-29 ENCOUNTER — Other Ambulatory Visit: Payer: Self-pay

## 2019-10-29 ENCOUNTER — Encounter: Payer: Self-pay | Admitting: Internal Medicine

## 2019-10-29 VITALS — BP 102/70 | HR 78 | Temp 98.0°F | Ht 68.0 in | Wt 183.0 lb

## 2019-10-29 DIAGNOSIS — Z23 Encounter for immunization: Secondary | ICD-10-CM

## 2019-10-29 DIAGNOSIS — E7800 Pure hypercholesterolemia, unspecified: Secondary | ICD-10-CM

## 2019-10-29 DIAGNOSIS — Z0000 Encounter for general adult medical examination without abnormal findings: Secondary | ICD-10-CM | POA: Diagnosis not present

## 2019-10-29 DIAGNOSIS — R972 Elevated prostate specific antigen [PSA]: Secondary | ICD-10-CM

## 2019-10-29 DIAGNOSIS — R7302 Impaired glucose tolerance (oral): Secondary | ICD-10-CM | POA: Diagnosis not present

## 2019-10-29 DIAGNOSIS — Z85828 Personal history of other malignant neoplasm of skin: Secondary | ICD-10-CM

## 2019-10-29 LAB — POCT URINALYSIS DIPSTICK
Appearance: NEGATIVE
Bilirubin, UA: NEGATIVE
Blood, UA: NEGATIVE
Glucose, UA: NEGATIVE
Leukocytes, UA: NEGATIVE
Nitrite, UA: NEGATIVE
Odor: NEGATIVE
Protein, UA: NEGATIVE
Spec Grav, UA: 1.01 (ref 1.010–1.025)
Urobilinogen, UA: 0.2 E.U./dL
pH, UA: 6.5 (ref 5.0–8.0)

## 2019-10-29 NOTE — Patient Instructions (Addendum)
It was a pleasure to see you today. Tetanus update given today and is good for 10 years. RTC in one year or as needed. Fasting labs drawn and are pending.  Colonoscopy later this week.  Add: Patient called to say GI thought he needed Cardiology evaluation based on EKG findings.

## 2019-10-30 ENCOUNTER — Encounter: Payer: Self-pay | Admitting: Gastroenterology

## 2019-10-30 LAB — CBC WITH DIFFERENTIAL/PLATELET
Absolute Monocytes: 413 cells/uL (ref 200–950)
Basophils Absolute: 19 cells/uL (ref 0–200)
Basophils Relative: 0.4 %
Eosinophils Absolute: 110 cells/uL (ref 15–500)
Eosinophils Relative: 2.3 %
HCT: 44.4 % (ref 38.5–50.0)
Hemoglobin: 14.9 g/dL (ref 13.2–17.1)
Lymphs Abs: 1632 cells/uL (ref 850–3900)
MCH: 29 pg (ref 27.0–33.0)
MCHC: 33.6 g/dL (ref 32.0–36.0)
MCV: 86.5 fL (ref 80.0–100.0)
MPV: 10.5 fL (ref 7.5–12.5)
Monocytes Relative: 8.6 %
Neutro Abs: 2626 cells/uL (ref 1500–7800)
Neutrophils Relative %: 54.7 %
Platelets: 275 10*3/uL (ref 140–400)
RBC: 5.13 10*6/uL (ref 4.20–5.80)
RDW: 13.2 % (ref 11.0–15.0)
Total Lymphocyte: 34 %
WBC: 4.8 10*3/uL (ref 3.8–10.8)

## 2019-10-30 LAB — LIPID PANEL
Cholesterol: 224 mg/dL — ABNORMAL HIGH (ref <200)
HDL: 99 mg/dL (ref >=40)
LDL Cholesterol (Calc): 112 mg/dL (calc) — ABNORMAL HIGH
Non-HDL Cholesterol (Calc): 125 mg/dL (calc) (ref <130)
Total CHOL/HDL Ratio: 2.3 (calc) (ref <5.0)
Triglycerides: 51 mg/dL (ref <150)

## 2019-10-30 LAB — COMPLETE METABOLIC PANEL WITH GFR
AG Ratio: 1.7 (calc) (ref 1.0–2.5)
ALT: 32 U/L (ref 9–46)
AST: 30 U/L (ref 10–35)
Albumin: 4.3 g/dL (ref 3.6–5.1)
Alkaline phosphatase (APISO): 60 U/L (ref 35–144)
BUN: 17 mg/dL (ref 7–25)
CO2: 28 mmol/L (ref 20–32)
Calcium: 9.5 mg/dL (ref 8.6–10.3)
Chloride: 101 mmol/L (ref 98–110)
Creat: 0.88 mg/dL (ref 0.70–1.18)
GFR, Est African American: 101 mL/min/{1.73_m2} (ref >=60)
GFR, Est Non African American: 87 mL/min/{1.73_m2} (ref >=60)
Globulin: 2.5 g/dL (calc) (ref 1.9–3.7)
Glucose, Bld: 96 mg/dL (ref 65–99)
Potassium: 4.5 mmol/L (ref 3.5–5.3)
Sodium: 138 mmol/L (ref 135–146)
Total Bilirubin: 0.8 mg/dL (ref 0.2–1.2)
Total Protein: 6.8 g/dL (ref 6.1–8.1)

## 2019-10-30 LAB — PSA: PSA: 4.6 ng/mL — ABNORMAL HIGH (ref <=4.0)

## 2019-10-30 NOTE — Progress Notes (Signed)
Subjective:    Patient ID: Grant Griffin, male    DOB: February 10, 1951, 69 y.o.   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  He watches his diet and plays golf.  His general health is excellent.  He has a history of impaired glucose tolerance.  No known drug allergies.  Past medical history: Left rotator cuff surgery 2004.  No history of serious illnesses requiring hospitalization.  Was seen by Dr. Wynonia Lawman January 2011 for abnormal EKG with left axis deviation and lateral T wave inversions dating back some years.  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  He had a negative Cardiolite study in 2004.  In 2011 he had a negative nuclear medicine test.  Prior history of sciatica.  History of allergic rhinitis and Raynaud's phenomenon.  Colonoscopy by Dr. Sharlett Iles in 2011 was normal.  Is to have repeat colonoscopy later this week.  Social history: He is married.  He has a son who is a Pharmacist, community who now practices here.  Patient has a daughter who is a physical therapist and lives in [place].  He has grandchildren.  He has never smoked.  Enjoys Civil War history.  He consumes wine.  Does not smoke.  Family history: Father with history of ischemic heart disease.  Father had MI at age 56.  Mother died this past year in her late 45s of a stroke.  1 brother in good health.  1 sister in good health.  1 sister overweight but generally healthy.  He sees Dr. Diona Fanti every 2 years for prostate exam.  No significant nocturia.  However, this year his PSA is 4.6 and 2 years ago was 3.8.  We have faxed these results to Dr. Diona Fanti.  I think patient may want to see him in the next couple of months or so.    Review of Systems  Respiratory: Negative.   Cardiovascular: Negative.   Gastrointestinal: Negative.   Genitourinary: Negative.   Neurological: Negative.   Psychiatric/Behavioral:  Negative.        Objective:   Physical Exam Vitals reviewed.  Constitutional:      General: He is not in acute distress.    Appearance: Normal appearance.  HENT:     Head: Normocephalic and atraumatic.     Right Ear: Tympanic membrane normal.     Left Ear: Tympanic membrane normal.     Nose: Nose normal.  Eyes:     General: No scleral icterus.       Right eye: No discharge.        Left eye: No discharge.     Extraocular Movements: Extraocular movements intact.     Conjunctiva/sclera: Conjunctivae normal.     Pupils: Pupils are equal, round, and reactive to light.  Neck:     Vascular: No carotid bruit.  Cardiovascular:     Rate and Rhythm: Normal rate and regular rhythm.     Heart sounds: Normal heart sounds. No murmur.  Pulmonary:     Effort: No respiratory distress.     Breath sounds: Normal breath sounds. No wheezing or rales.  Abdominal:     General: Bowel sounds are normal. There is no distension.     Palpations: Abdomen is soft. There is no mass.     Tenderness: There is no abdominal tenderness. There is no guarding.  Genitourinary:    Comments: Deferred to urologist Musculoskeletal:  Cervical back: Neck supple. No rigidity.     Right lower leg: No edema.     Left lower leg: No edema.  Lymphadenopathy:     Cervical: No cervical adenopathy.  Skin:    General: Skin is warm and dry.  Neurological:     General: No focal deficit present.     Mental Status: He is alert and oriented to person, place, and time.     Cranial Nerves: No cranial nerve deficit.     Motor: No weakness.     Coordination: Coordination normal.     Gait: Gait normal.  Psychiatric:        Mood and Affect: Mood normal.        Behavior: Behavior normal.        Thought Content: Thought content normal.        Judgment: Judgment normal.           Assessment & Plan:    PSA elevated at 4.6 and result will be sent to Dr. Diona Fanti.  Patient generally sees him every 2 years and she is to  follow-up with him this year.  Pure hypercholesterolemia-continue exercise regimen and watch diet.  Recheck in 1 year after pandemic has subsided.  History of impaired glucose tolerance-fasting glucose is normal  Health maintenance-having colonoscopy this week  History of left fascicular hemiblock diagnosed by Dr. Wynonia Lawman a number of years ago and asymptomatic  History of basal cell carcinoma 2019 seen by Dr. Elvera Lennox.  Followed regularly by Dermatology.  Plan: Return in 6 months.  We can check his lipids at that time.  He can follow-up with urologist regarding elevated PSA.  Has had both COVID-19 vaccines.  Tetanus immunization given today.  Subjective:   Patient presents for Medicare Annual/Subsequent preventive examination.  Review Past Medical/Family/Social: See above  Risk Factors  Current exercise habits: Gets plenty of exercise Dietary issues discussed: Low-fat low carbohydrate  Cardiac risk factors: Mother had stroke in her late 24s.  Father had MI in his 73s.  Depression Screen  (Note: if answer to either of the following is "Yes", a more complete depression screening is indicated)   Over the past two weeks, have you felt down, depressed or hopeless? No  Over the past two weeks, have you felt little interest or pleasure in doing things? No Have you lost interest or pleasure in daily life? No Do you often feel hopeless? No Do you cry easily over simple problems? No   Activities of Daily Living  In your present state of health, do you have any difficulty performing the following activities?:   Driving? No  Managing money? No  Feeding yourself? No  Getting from bed to chair? No  Climbing a flight of stairs? No  Preparing food and eating?: No  Bathing or showering? No  Getting dressed: No  Getting to the toilet? No  Using the toilet:No  Moving around from place to place: No  In the past year have you fallen or had a near fall?:No  Are you sexually active?  Yes do  you have more than one partner? No   Hearing Difficulties: No  Do you often ask people to speak up or repeat themselves? No  Do you experience ringing or noises in your ears? No  Do you have difficulty understanding soft or whispered voices? No  Do you feel that you have a problem with memory? No Do you often misplace items? No    Home Safety:  Do you  have a smoke alarm at your residence? Yes Do you have grab bars in the bathroom?  None Do you have throw rugs in your house?  Yes   Cognitive Testing  Alert? Yes Normal Appearance?Yes  Oriented to person? Yes Place? Yes  Time? Yes  Recall of three objects? Yes  Can perform simple calculations? Yes  Displays appropriate judgment?Yes  Can read the correct time from a watch face?Yes   List the Names of Other Physician/Practitioners you currently use:  See referral list for the physicians patient is currently seeing.     Review of Systems: See above   Objective:     General appearance: Well-developed well-nourished in no acute distress Head: Normocephalic, without obvious abnormality, atraumatic  Eyes: conj clear, EOMi PEERLA  Ears: normal TM's and external ear canals both ears  Nose: Nares normal. Septum midline. Mucosa normal. No drainage or sinus tenderness.  Throat: lips, mucosa, and tongue normal; teeth and gums normal  Neck: no adenopathy, no carotid bruit, no JVD, supple, symmetrical, trachea midline and thyroid not enlarged, symmetric, no tenderness/mass/nodules  No CVA tenderness.  Lungs: clear to auscultation bilaterally  Breasts: normal appearance, no masses or tenderness Heart: regular rate and rhythm, S1, S2 normal, no murmur, click, rub or gallop  Abdomen: soft, non-tender; bowel sounds normal; no masses, no organomegaly  Musculoskeletal: ROM normal in all joints, no crepitus, no deformity, Normal muscle strengthen. Back  is symmetric, no curvature. Skin: Skin color, texture, turgor normal. No rashes or lesions   Lymph nodes: Cervical, supraclavicular, and axillary nodes normal.  Neurologic: CN 2 -12 Normal, Normal symmetric reflexes. Normal coordination and gait  Psych: Alert & Oriented x 3, Mood appear stable.    Assessment:    Annual wellness medicare exam   Plan:    During the course of the visit the patient was educated and counseled about appropriate screening and preventive services including:   Tetanus immunization update given  Is advised COVID-19 vaccine     Patient Instructions (the written plan) was given to the patient.  Medicare Attestation  I have personally reviewed:  The patient's medical and social history  Their use of alcohol, tobacco or illicit drugs  Their current medications and supplements  The patient's functional ability including ADLs,fall risks, home safety risks, cognitive, and hearing and visual impairment  Diet and physical activities  Evidence for depression or mood disorders  The patient's weight, height, BMI, and visual acuity have been recorded in the chart. I have made referrals, counseling, and provided education to the patient based on review of the above and I have provided the patient with a written personalized care plan for preventive services.

## 2019-11-01 ENCOUNTER — Ambulatory Visit (AMBULATORY_SURGERY_CENTER): Payer: PPO | Admitting: Gastroenterology

## 2019-11-01 ENCOUNTER — Encounter: Payer: Self-pay | Admitting: Gastroenterology

## 2019-11-01 ENCOUNTER — Other Ambulatory Visit: Payer: Self-pay

## 2019-11-01 VITALS — BP 119/74 | HR 57 | Temp 95.3°F | Resp 15 | Ht 67.0 in | Wt 185.0 lb

## 2019-11-01 DIAGNOSIS — Z1211 Encounter for screening for malignant neoplasm of colon: Secondary | ICD-10-CM

## 2019-11-01 DIAGNOSIS — D123 Benign neoplasm of transverse colon: Secondary | ICD-10-CM

## 2019-11-01 DIAGNOSIS — D124 Benign neoplasm of descending colon: Secondary | ICD-10-CM

## 2019-11-01 MED ORDER — SODIUM CHLORIDE 0.9 % IV SOLN: 500.0000 mL | Freq: Once | INTRAVENOUS | Status: DC

## 2019-11-01 NOTE — Progress Notes (Signed)
VS- Nash Mantis Temp- June Bullock  Pt had both doses of vaccine greater than 2 weeks ago Pt's states no medical or surgical changes since previsit or office visit.

## 2019-11-01 NOTE — Progress Notes (Signed)
Called to room to assist during endoscopic procedure.  Patient ID and intended procedure confirmed with present staff. Received instructions for my participation in the procedure from the performing physician.  

## 2019-11-01 NOTE — Op Note (Signed)
Grant Griffin Patient Name: Grant Griffin Procedure Date: 11/01/2019 7:50 AM MRN: [id number] Endoscopist: Mallie Mussel L. Loletha Carrow , MD Age: 69 Referring MD:  Date of Birth: 12/27/1950 Gender: Male Account #: [id number] Procedure:                Colonoscopy Indications:              Screening for colorectal malignant neoplasm Medicines:                Monitored Anesthesia Care Procedure:                Pre-Anesthesia Assessment:                           - Prior to the procedure, a History and Physical                            was performed, and patient medications and                            allergies were reviewed. The patient's tolerance of                            previous anesthesia was also reviewed. The risks                            and benefits of the procedure and the sedation                            options and risks were discussed with the patient.                            All questions were answered, and informed consent                            was obtained. Prior Anticoagulants: The patient has                            taken no previous anticoagulant or antiplatelet                            agents. ASA Grade Assessment: II - A patient with                            mild systemic disease. After reviewing the risks                            and benefits, the patient was deemed in                            satisfactory condition to undergo the procedure.                           After obtaining informed consent, the colonoscope  was passed under direct vision. Throughout the                            procedure, the patient's blood pressure, pulse, and                            oxygen saturations were monitored continuously. The                            Colonoscope was introduced through the anus and                            advanced to the the cecum, identified by                            appendiceal orifice and ileocecal  valve. The                            colonoscopy was performed without difficulty. The                            patient tolerated the procedure well. The quality                            of the bowel preparation was excellent. The                            ileocecal valve, appendiceal orifice, and rectum                            were photographed. The bowel preparation used was                            Miralax. Scope In: 8:00:06 AM Scope Out: 8:17:41 AM Scope Withdrawal Time: 0 hours 10 minutes 28 seconds  Total Procedure Duration: 0 hours 17 minutes 35 seconds  Findings:                 The perianal and digital rectal examinations were                            normal.                           Two sessile polyps were found in the proximal                            descending colon and proximal transverse colon. The                            polyps were 2 to 4 mm in size. These polyps were                            removed with a cold snare. Resection and retrieval  were complete.                           Multiple small-mouthed diverticula were found in                            the sigmoid colon.                           The exam was otherwise without abnormality on                            direct and retroflexion views. Complications:            No immediate complications. Estimated Blood Loss:     Estimated blood loss was minimal. Impression:               - Two 2 to 4 mm polyps in the proximal descending                            colon and in the proximal transverse colon, removed                            with a cold snare. Resected and retrieved.                           - Diverticulosis in the sigmoid colon.                           - The examination was otherwise normal on direct                            and retroflexion views. Recommendation:           - Patient has a contact number available for                             emergencies. The signs and symptoms of potential                            delayed complications were discussed with the                            patient. Return to normal activities tomorrow.                            Written discharge instructions were provided to the                            patient.                           - Resume previous diet.                           - Continue present medications.                           -  Await pathology results.                           - Repeat colonoscopy is recommended for                            surveillance. The colonoscopy date will be                            determined after pathology results from today's                            exam become available for review. Shareece Bultman L. Loletha Carrow, MD 11/01/2019 8:21:33 AM This report has been signed electronically.

## 2019-11-01 NOTE — Patient Instructions (Signed)
Handouts :  Polyps and diverticulosis  continue previous diet  Continue current medications Await pathology  YOU HAD AN ENDOSCOPIC PROCEDURE TODAY AT [facility]:   Refer to the procedure report that was given to you for any specific questions about what was found during the examination.  If the procedure report does not answer your questions, please call your gastroenterologist to clarify.  If you requested that your care partner not be given the details of your procedure findings, then the procedure report has been included in a sealed envelope for you to review at your convenience later.  YOU SHOULD EXPECT: Some feelings of bloating in the abdomen. Passage of more gas than usual.  Walking can help get rid of the air that was put into your GI tract during the procedure and reduce the bloating. If you had a lower endoscopy (such as a colonoscopy or flexible sigmoidoscopy) you may notice spotting of blood in your stool or on the toilet paper. If you underwent a bowel prep for your procedure, you may not have a normal bowel movement for a few days.  Please Note:  You might notice some irritation and congestion in your nose or some drainage.  This is from the oxygen used during your procedure.  There is no need for concern and it should clear up in a day or so.  SYMPTOMS TO REPORT IMMEDIATELY:   Following lower endoscopy (colonoscopy or flexible sigmoidoscopy):  Excessive amounts of blood in the stool  Significant tenderness or worsening of abdominal pains  Swelling of the abdomen that is new, acute  Fever of 100F or higher   Following upper endoscopy (EGD)  Vomiting of blood or coffee ground material  New chest pain or pain under the shoulder blades  Painful or persistently difficult swallowing  New shortness of breath  Fever of 100F or higher  Black, tarry-looking stools  For urgent or emergent issues, a gastroenterologist can be reached at any hour by calling  [phone].   DIET:  We do recommend a small meal at first, but then you may proceed to your regular diet.  Drink plenty of fluids but you should avoid alcoholic beverages for 24 hours.  ACTIVITY:  You should plan to take it easy for the rest of today and you should NOT DRIVE or use heavy machinery until tomorrow (because of the sedation medicines used during the test).    FOLLOW UP: Our staff will call the number listed on your records 48-72 hours following your procedure to check on you and address any questions or concerns that you may have regarding the information given to you following your procedure. If we do not reach you, we will leave a message.  We will attempt to reach you two times.  During this call, we will ask if you have developed any symptoms of COVID 19. If you develop any symptoms (ie: fever, flu-like symptoms, shortness of breath, cough etc.) before then, please call [phone].  If you test positive for Covid 19 in the 2 weeks post procedure, please call and report this information to [place].    If any biopsies were taken you will be contacted by phone or by letter within the next 1-3 weeks.  Please call us at [phone] if you have not heard about the biopsies in 3 weeks.    SIGNATURES/CONFIDENTIALITY: You and/or your care partner have signed paperwork which will be entered into your electronic medical record.  These  signatures attest to the fact that that the information above on your After Visit Summary has been reviewed and is understood.  Full responsibility of the confidentiality of this discharge information lies with you and/or your care-partner.YOU HAD AN ENDOSCOPIC PROCEDURE TODAY AT [facility]:   Refer to the procedure report that was given to you for any specific questions about what was found during the examination.  If the procedure report does not answer your questions, please call your gastroenterologist to clarify.  If you requested that  your care partner not be given the details of your procedure findings, then the procedure report has been included in a sealed envelope for you to review at your convenience later.  YOU SHOULD EXPECT: Some feelings of bloating in the abdomen. Passage of more gas than usual.  Walking can help get rid of the air that was put into your GI tract during the procedure and reduce the bloating. If you had a lower endoscopy (such as a colonoscopy or flexible sigmoidoscopy) you may notice spotting of blood in your stool or on the toilet paper. If you underwent a bowel prep for your procedure, you may not have a normal bowel movement for a few days.  Please Note:  You might notice some irritation and congestion in your nose or some drainage.  This is from the oxygen used during your procedure.  There is no need for concern and it should clear up in a day or so.  SYMPTOMS TO REPORT IMMEDIATELY:   Following lower endoscopy (colonoscopy or flexible sigmoidoscopy):  Excessive amounts of blood in the stool  Significant tenderness or worsening of abdominal pains  Swelling of the abdomen that is new, acute  Fever of 100F or higher  For urgent or emergent issues, a gastroenterologist can be reached at any hour by calling [phone].   DIET:  We do recommend a small meal at first, but then you may proceed to your regular diet.  Drink plenty of fluids but you should avoid alcoholic beverages for 24 hours.  ACTIVITY:  You should plan to take it easy for the rest of today and you should NOT DRIVE or use heavy machinery until tomorrow (because of the sedation medicines used during the test).    FOLLOW UP: Our staff will call the number listed on your records 48-72 hours following your procedure to check on you and address any questions or concerns that you may have regarding the information given to you following your procedure. If we do not reach you, we will leave a message.  We will attempt to reach you two  times.  During this call, we will ask if you have developed any symptoms of COVID 19. If you develop any symptoms (ie: fever, flu-like symptoms, shortness of breath, cough etc.) before then, please call [phone].  If you test positive for Covid 19 in the 2 weeks post procedure, please call and report this information to [place].    If any biopsies were taken you will be contacted by phone or by letter within the next 1-3 weeks.  Please call us at [phone] if you have not heard about the biopsies in 3 weeks.    SIGNATURES/CONFIDENTIALITY: You and/or your care partner have signed paperwork which will be entered into your electronic medical record.  These signatures attest to the fact that that the information above on your After Visit Summary has been reviewed and is understood.  Full responsibility of the confidentiality of  this discharge information lies with you and/or your care-partner.

## 2019-11-01 NOTE — Progress Notes (Signed)
PT taken to PACU. Monitors in place. VSS. Report given to RN. 

## 2019-11-05 ENCOUNTER — Telehealth: Payer: Self-pay

## 2019-11-05 ENCOUNTER — Telehealth: Payer: Self-pay | Admitting: *Deleted

## 2019-11-05 NOTE — Telephone Encounter (Signed)
  Follow up Call-  Call back number 11/01/2019  Post procedure Call Back phone  # [phone]  Permission to leave phone message Yes  Some recent data might be hidden     Patient questions:  Do you have a fever, pain , or abdominal swelling? No. Pain Score  0 *  Have you tolerated food without any problems? Yes.    Have you been able to return to your normal activities? Yes.    Do you have any questions about your discharge instructions: Diet   No. Medications  No. Follow up visit  No.  Do you have questions or concerns about your Care? No.  Actions: * If pain score is 4 or above: No action needed, pain <4. 1. Have you developed a fever since your procedure? no  2.   Have you had an respiratory symptoms (SOB or cough) since your procedure? no  3.   Have you tested positive for COVID 19 since your procedure no  4.   Have you had any family members/close contacts diagnosed with the COVID 19 since your procedure?  no   If yes to any of these questions please route to Joylene John, RN and Alphonsa Gin, Therapist, sports.

## 2019-11-05 NOTE — Telephone Encounter (Signed)
Follow up call made, no answer left message.

## 2019-11-06 ENCOUNTER — Encounter: Payer: Self-pay | Admitting: Gastroenterology

## 2019-11-08 ENCOUNTER — Telehealth: Payer: Self-pay | Admitting: Internal Medicine

## 2019-11-08 NOTE — Telephone Encounter (Signed)
Dr. Renold Genta,    Please see the portal message from this patient.  Thanks for replying to me after I recently sent you the message on EKG question.  I had suggested to him that an EKG at your office should be fine.  There may not even be anything requiring a cardiology evaluation.  We were just unable to get a good baseline on the monitor, and there seemed to be occasional irregularity with pulse in the 60s to 70s   However you see fit to proceed.  - H. Danis

## 2019-11-08 NOTE — Telephone Encounter (Signed)
I have left him a message. 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 I do not see what they were concerned about in any colonoscopy report. Suspect anesthesia had this concern. Ok to refer to Dr. Warren Lacy

## 2019-11-08 NOTE — Telephone Encounter (Signed)
Grant Griffin [id number]  [name] called to say he needs a referral to a cardiologist, because when he had his colonoscopy the doctor and nurse anesthetist seen something on his EKG. I let him know we would need to know what we are referring him for, so he was going to contact Dr Loletha Carrow and have them send something over or send you a message as to what they seen and why he needs to see cardiologist. He has seen Dr Wynonia Lawman in the past, however he is not seeing patients and would need to be referred. He would like to see Dr Percival Spanish. I do not see current EKG in his chart.

## 2019-11-08 NOTE — Telephone Encounter (Signed)
Called and let patient know that referral has been placed.

## 2019-12-06 DIAGNOSIS — I4819 Other persistent atrial fibrillation: Secondary | ICD-10-CM

## 2019-12-06 HISTORY — DX: Other persistent atrial fibrillation: I48.19

## 2019-12-13 NOTE — Progress Notes (Signed)
Cardiology Office Note  Already Date:  12/14/2019   ID:  Grant Griffin, DOB May 17, 1951, MRN [id number]  PCP:  Elby Showers, MD  Cardiologist:   No primary care provider on file. Referring:  Elby Showers, MD  Chief Complaint  Patient presents with  . Abnormal ECG      History of Present Illness: Grant Griffin is a 69 y.o. male who is referred by Elby Showers, MD for evaluation of an abnormal rhythm.  He had this noted when he had a colonoscopy recently.  I see a report that it was irregular.  There was no EKG.  Distant EKGs from 2016 and 2004 demonstrate deep T wave inversions in the lateral leads.  He was seen by another cardiology group.  He reports having a stress test in the past it sounds like perfusion study.  There may have been an echocardiogram.  He has never been told he had any significant abnormalities.  He would not notice that he has irregular heart rhythm.  He feels fine and has an active and healthy lifestyle.  He denies any palpitations, presyncope or syncope.  He has no shortness of breath, PND or orthopnea.  He said no chest pressure, neck or arm discomfort.  He has had no weight gain or edema.   Past Medical History:  Diagnosis Date  . Raynaud's disease     Past Surgical History:  Procedure Laterality Date  . COLONOSCOPY    . ROTATOR CUFF REPAIR Left 2004     Current Outpatient Medications  Medication Sig Dispense Refill  . rivaroxaban (XARELTO) 20 MG TABS tablet Take 1 tablet (20 mg total) by mouth daily with supper. 90 tablet 3   No current facility-administered medications for this visit.    Allergies:   Patient has no known allergies.    Social History:  The patient  reports that he has never smoked. He has never used smokeless tobacco. He reports current alcohol use. He reports that he does not use drugs.   Family History:  The patient's family history includes Heart disease (age of onset: 49) in his father.    ROS:  Please see the history  of present illness.   Otherwise, review of systems are positive for none.   All other systems are reviewed and negative.    PHYSICAL EXAM: VS:  BP 126/72   Pulse 71   Temp (!) 97.4 F (36.3 C)   Ht 5\' 9"  (1.753 m)   Wt 188 lb 6.4 oz (85.5 kg)   SpO2 94% Comment: patient's hands are freezing  BMI 27.82 kg/m  , BMI Body mass index is 27.82 kg/m. GENERAL:  Well appearing HEENT:  Pupils equal round and reactive, fundi not visualized, oral mucosa unremarkable NECK:  No jugular venous distention, waveform within normal limits, carotid upstroke brisk and symmetric, no bruits, no thyromegaly LYMPHATICS:  No cervical, inguinal adenopathy LUNGS:  Clear to auscultation bilaterally BACK:  No CVA tenderness CHEST:  Unremarkable HEART:  PMI not displaced or sustained,S1 and S2 within normal limits, no S3, no clicks, no rubs, no murmurs, irregular ABD:  Flat, positive bowel sounds normal in frequency in pitch, no bruits, no rebound, no guarding, no midline pulsatile mass, no hepatomegaly, no splenomegaly EXT:  2 plus pulses throughout, no edema, no cyanosis no clubbing SKIN:  No rashes no nodules NEURO:  Cranial nerves II through XII grossly intact, motor grossly intact throughout PSYCH:  Cognitively intact, oriented to person  place and time    EKG:  EKG is ordered today. The ekg ordered today demonstrates atrial fibrillation with a slow ventricular rate, left axis deviation, poor anterior R wave progression, lateral T wave inversions unchanged from previous, borderline QTC.   Recent Labs: 10/29/2019: ALT 32; BUN 17; Creat 0.88; Hemoglobin 14.9; Platelets 275; Potassium 4.5; Sodium 138    Lipid Panel    Component Value Date/Time   CHOL 224 (H) 10/29/2019 1232   TRIG 51 10/29/2019 1232   HDL 99 10/29/2019 1232   CHOLHDL 2.3 10/29/2019 1232   VLDL 9 12/04/2015 0911   LDLCALC 112 (H) 10/29/2019 1232   LDLDIRECT 101.2 09/03/2009 0000      Wt Readings from Last 3 Encounters:  12/14/19  188 lb 6.4 oz (85.5 kg)  11/01/19 185 lb (83.9 kg)  10/29/19 183 lb (83 kg)      Other studies Reviewed: Additional studies/ records that were reviewed today include: Labs. Review of the above records demonstrates:  Please see elsewhere in the note.     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  He has a slow ventricular rate and really does not notice this. Grant Griffin has a CHA2DS2 - VASc score of 1.  I am going to check a TSH and an echocardiogram.  Because of his markedly abnormal EKG and his family history I am going to get a coronary calcium score to screen for obstructive coronary disease.  Although his risk is low I am going to start him on Xarelto in anticipation of cardioversion and go ahead and schedule this.  We had a long discussion about the risk benefits of anticoagulation and the pathophysiology of fibrillation.  COVID EDUCATION: He has had both of his vaccines.  Current medicines are reviewed at length with the patient today.  The patient does not have concerns regarding medicines.  The following changes have been made:  As above  Labs/ tests ordered today include:   Orders Placed This Encounter  Procedures  . CT CARDIAC SCORING  . TSH  . CBC  . Basic metabolic panel  . EKG 12-Lead  . ECHOCARDIOGRAM COMPLETE     Disposition:   FU with me after the cardioversion.     Signed, Minus Breeding, MD  12/14/2019 9:13 AM    [facility]

## 2019-12-14 ENCOUNTER — Ambulatory Visit: Payer: PPO | Admitting: Cardiology

## 2019-12-14 ENCOUNTER — Other Ambulatory Visit: Payer: Self-pay

## 2019-12-14 ENCOUNTER — Encounter: Payer: Self-pay | Admitting: Cardiology

## 2019-12-14 VITALS — BP 126/72 | HR 71 | Temp 97.4°F | Ht 69.0 in | Wt 188.4 lb

## 2019-12-14 DIAGNOSIS — R9431 Abnormal electrocardiogram [ECG] [EKG]: Secondary | ICD-10-CM | POA: Diagnosis not present

## 2019-12-14 DIAGNOSIS — I4891 Unspecified atrial fibrillation: Secondary | ICD-10-CM

## 2019-12-14 DIAGNOSIS — Z7189 Other specified counseling: Secondary | ICD-10-CM | POA: Diagnosis not present

## 2019-12-14 MED ORDER — RIVAROXABAN 20 MG PO TABS: 20.0000 mg | ORAL_TABLET | Freq: Every day | ORAL | 3 refills | Status: DC

## 2019-12-14 NOTE — Patient Instructions (Signed)
Medication Instructions:  Start Xarelto 20mg  daily *If you need a refill on your cardiac medications before your next appointment, please call your pharmacy*  Lab Work: Your physician recommends that you return for lab work today [facility])  Your physician recommends that you return for lab work 3 days before cardioversion (CBC, BMP)  Testing/Procedures: Your physician has requested that you have an echocardiogram. Echocardiography is a painless test that uses sound waves to create images of your heart. It provides your doctor with information about the size and shape of your heart and how well your heart's chambers and valves are working. This procedure takes approximately one hour. There are no restrictions for this procedure. [place]  Dr. Percival Spanish has ordered a CT coronary calcium score. This test is done at [address]. This is $150 out of pocket. Coronary CalciumScan A coronary calcium scan is an imaging test used to look for deposits of calcium and other fatty materials (plaques) in the inner lining of the blood vessels of the heart (coronary arteries). These deposits of calcium and plaques can partly clog and narrow the coronary arteries without producing any symptoms or warning signs. This puts a person at risk for a heart attack. This test can detect these deposits before symptoms develop. Tell a health care provider about:  Any allergies you have.  All medicines you are taking, including vitamins, herbs, eye drops, creams, and over-the-counter medicines.  Any problems you or family members have had with anesthetic medicines.  Any blood disorders you have.  Any surgeries you have had.  Any medical conditions you have.  Whether you are pregnant or may be pregnant. What are the risks? Generally, this is a safe procedure. However, problems may occur, including:  Harm to a pregnant woman and her unborn baby. This test involves the use of  radiation. Radiation exposure can be dangerous to a pregnant woman and her unborn baby. If you are pregnant, you generally should not have this procedure done.  Slight increase in the risk of cancer. This is because of the radiation involved in the test. What happens before the procedure? No preparation is needed for this procedure. What happens during the procedure?  You will undress and remove any jewelry around your neck or chest.  You will put on a hospital gown.  Sticky electrodes will be placed on your chest. The electrodes will be connected to an electrocardiogram (ECG) machine to record a tracing of the electrical activity of your heart.  A CT scanner will take pictures of your heart. During this time, you will be asked to lie still and hold your breath for 2-3 seconds while a picture of your heart is being taken. The procedure may vary among health care providers and hospitals. What happens after the procedure?  You can get dressed.  You can return to your normal activities.  It is up to you to get the results of your test. Ask your health care provider, or the department that is doing the test, when your results will be ready. Summary  A coronary calcium scan is an imaging test used to look for deposits of calcium and other fatty materials (plaques) in the inner lining of the blood vessels of the heart (coronary arteries).  Generally, this is a safe procedure. Tell your health care provider if you are pregnant or may be pregnant.  No preparation is needed for this procedure.  A CT scanner will take pictures of your  heart.  You can return to your normal activities after the scan is done. This information is not intended to replace advice given to you by your health care provider. Make sure you discuss any questions you have with your health care provider. Document Released: 02/19/2008 Document Revised: 07/12/2016 Document Reviewed: 07/12/2016 Elsevier Interactive Patient  Education  2017 [place]: At [facility], you and your health needs are our priority.  As part of our continuing mission to provide you with exceptional heart care, we have created designated Provider Care Teams.  These Care Teams include your primary Cardiologist (physician) and Advanced Practice Providers (APPs -  Physician Assistants and Nurse Practitioners) who all work together to provide you with the care you need, when you need it.  Your next appointment:   6 week(s)  The format for your next appointment:   In Person  Provider:   Minus Breeding, MD   Other Instructions Dear Grant Griffin You are scheduled for a Cardioversion on Friday Jan 11, 2020 with Dr. Harrington Challenger.  Please arrive at the [facility] (Main Entrance A) at [facility]: [address] [place], [place] [address] at 06:30 am.   DIET: Nothing to eat or drink after midnight except a sip of water with medications (see medication instructions below)  Medication Instructions: Continue your anticoagulant: Xarelto You will need to continue your anticoagulant after your procedure until you are told by your  Provider that it is safe to stop   Labs: If patient is on Coumadin, patient needs pt/INR, CBC, BMET within 3 days (No pt/INR needed for patients taking Xarelto, Eliquis, Pradaxa) For patients receiving anesthesia for TEE and all Cardioversion patients: BMET, CBC within 1 week  Come to: [place], [address] 3 days before your cardioversion   You are scheduled for a Covid Screening on Jan 08, 2020 at 09:20am. This is a Drive Up Visit at the [facility] [address] [place]. Someone will direct you to the appropriate testing line. Stay in your car and someone will be with you shortly   You must have a responsible person to drive you home and stay in the waiting area during your procedure. Failure to do so could result in cancellation.  Bring your insurance  cards.  *Special Note: Every effort is made to have your procedure done on time. Occasionally there are emergencies that occur at the hospital that may cause delays. Please be patient if a delay does occur.

## 2019-12-14 NOTE — Progress Notes (Signed)
Many thanks for seeing him

## 2019-12-15 LAB — TSH: TSH: 1.79 u[IU]/mL (ref 0.450–4.500)

## 2019-12-25 ENCOUNTER — Other Ambulatory Visit: Payer: Self-pay

## 2019-12-25 ENCOUNTER — Ambulatory Visit (HOSPITAL_COMMUNITY): Payer: PPO | Attending: Cardiovascular Disease

## 2019-12-25 ENCOUNTER — Ambulatory Visit (INDEPENDENT_AMBULATORY_CARE_PROVIDER_SITE_OTHER)
Admission: RE | Admit: 2019-12-25 | Discharge: 2019-12-25 | Disposition: A | Discharge status: Home / Self Care | Payer: Self-pay | Source: Ambulatory Visit | Attending: Cardiology | Admitting: Cardiology

## 2019-12-25 DIAGNOSIS — R9431 Abnormal electrocardiogram [ECG] [EKG]: Secondary | ICD-10-CM | POA: Insufficient documentation

## 2019-12-25 DIAGNOSIS — I4891 Unspecified atrial fibrillation: Secondary | ICD-10-CM | POA: Insufficient documentation

## 2020-01-01 ENCOUNTER — Telehealth: Payer: Self-pay | Admitting: Cardiology

## 2020-01-01 NOTE — Telephone Encounter (Signed)
Patient returning Grant Griffin call from yesterday in regards to echo results.

## 2020-01-01 NOTE — Telephone Encounter (Signed)
Spoke with patient, results reviewed. See result note.

## 2020-01-07 DIAGNOSIS — I340 Nonrheumatic mitral (valve) insufficiency: Secondary | ICD-10-CM | POA: Insufficient documentation

## 2020-01-07 DIAGNOSIS — I4819 Other persistent atrial fibrillation: Secondary | ICD-10-CM | POA: Insufficient documentation

## 2020-01-07 DIAGNOSIS — R931 Abnormal findings on diagnostic imaging of heart and coronary circulation: Secondary | ICD-10-CM | POA: Insufficient documentation

## 2020-01-07 NOTE — Progress Notes (Signed)
Cardiology Office Note  Already Date:  01/08/2020   ID:  ZIGMOND DISCH, DOB 1951/09/02, MRN [id number]  PCP:  Elby Showers, MD  Cardiologist:   Minus Breeding, MD Referring:  Elby Showers, MD  Chief Complaint  Patient presents with  . Atrial Fibrillation      History of Present Illness: Grant Griffin is a 69 y.o. male who is referred by Elby Showers, MD for evaluation of atrial fib and an abnormal EKG.  At the last visit I set him up for a DCCV.  I sent him for an echo which showed moderate MR.   He had an elevated coronary calcium as well on CT with a score of 110 which was 45% for his age and gender.    He is active and he denies any chest pain.  He really does not feel his palpitations.  He has had no presyncope or syncope.  He denies any shortness of breath, PND or orthopnea.  He said no weight gain or edema.  He is tolerating anticoagulation started.    Past Medical History:  Diagnosis Date  . Raynaud's disease     Past Surgical History:  Procedure Laterality Date  . COLONOSCOPY    . ROTATOR CUFF REPAIR Left 2004     Current Outpatient Medications  Medication Sig Dispense Refill  . rivaroxaban (XARELTO) 20 MG TABS tablet Take 1 tablet (20 mg total) by mouth daily with supper. 90 tablet 3   No current facility-administered medications for this visit.    Allergies:   Patient has no known allergies.    ROS:  Please see the history of present illness.   Otherwise, review of systems are positive for none.   All other systems are reviewed and negative.    PHYSICAL EXAM: VS:  BP 116/78   Pulse 63   Ht 5\' 9"  (1.753 m)   Wt 188 lb 12.8 oz (85.6 kg)   SpO2 99%   BMI 27.88 kg/m  , BMI Body mass index is 27.88 kg/m. GENERAL:  Well appearing NECK:  No jugular venous distention, waveform within normal limits, carotid upstroke brisk and symmetric, no bruits, no thyromegaly LUNGS:  Clear to auscultation bilaterally CHEST:  Unremarkable HEART:  PMI not displaced  or sustained,S1 and S2 within normal limits, no S3,  no clicks, no rubs, non murmurs, irregular  ABD:  Flat, positive bowel sounds normal in frequency in pitch, no bruits, no rebound, no guarding, no midline pulsatile mass, no hepatomegaly, no splenomegaly EXT:  2 plus pulses throughout, no edema, no cyanosis no clubbing   EKG:  EKG is   ordered today. The ekg ordered today demonstrates atrial fibrillation, rate 63, left axis deviation, lateral T wave inversions less pronounced than previous   Recent Labs: 10/29/2019: ALT 32; BUN 17; Creat 0.88; Hemoglobin 14.9; Platelets 275; Potassium 4.5; Sodium 138 12/14/2019: TSH 1.790    Lipid Panel    Component Value Date/Time   CHOL 224 (H) 10/29/2019 1232   TRIG 51 10/29/2019 1232   HDL 99 10/29/2019 1232   CHOLHDL 2.3 10/29/2019 1232   VLDL 9 12/04/2015 0911   LDLCALC 112 (H) 10/29/2019 1232   LDLDIRECT 101.2 09/03/2009 0000      Wt Readings from Last 3 Encounters:  01/08/20 188 lb 12.8 oz (85.6 kg)  12/14/19 188 lb 6.4 oz (85.5 kg)  11/01/19 185 lb (83.9 kg)      Other studies Reviewed: Additional studies/ records that were  reviewed today include: Echo, CT Review of the above records demonstrates:  Please see elsewhere in the note.     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  He has a slow ventricular rate and really does not notice this. Mr. MIKA RAJARAM has a CHA2DS2 - VASc score of 1.  As previously planned he is going to continue anticoagulation.  I will make plans about anticoagulation long-term after seeing him back  MR: He seems to have annular dilatation although he might have a click.  There is no evidence of prolapse.  I will follow this with an echocardiogram in 1 year.  ABNORMAL EKG:  He has elevated coronary calcium.  He will have POET (Plain Old Exercise Treadmill) following his cardioversion.  COVID EDUCATION: He has had both of his vaccines.  Current medicines are reviewed at length with the  patient today.  The patient does not have concerns regarding medicines.  The following changes have been made:  None  Labs/ tests ordered today include:   Orders Placed This Encounter  Procedures  . EXERCISE TOLERANCE TEST (ETT)     Disposition:   FU with me after the POET (Plain Old Exercise Treadmill)   Signed, Minus Breeding, MD  01/08/2020 5:16 PM    [place]

## 2020-01-07 NOTE — H&P (View-Only) (Signed)
Cardiology Office Note  Already Date:  01/08/2020   ID:  Grant Griffin, DOB [birth year], MRN [id number]  PCP:  Elby Showers, MD  Cardiologist:   Minus Breeding, MD Referring:  Elby Showers, MD  Chief Complaint  Patient presents with  . Atrial Fibrillation      History of Present Illness: Grant Griffin is a 69 y.o. male who is referred by Elby Showers, MD for evaluation of atrial fib and an abnormal EKG.  At the last visit I set him up for a DCCV.  I sent him for an echo which showed moderate MR.   He had an elevated coronary calcium as well on CT with a score of 110 which was 45% for his age and gender.    He is active and he denies any chest pain.  He really does not feel his palpitations.  He has had no presyncope or syncope.  He denies any shortness of breath, PND or orthopnea.  He said no weight gain or edema.  He is tolerating anticoagulation started.    Past Medical History:  Diagnosis Date  . Raynaud's disease     Past Surgical History:  Procedure Laterality Date  . COLONOSCOPY    . ROTATOR CUFF REPAIR Left 2004     Current Outpatient Medications  Medication Sig Dispense Refill  . rivaroxaban (XARELTO) 20 MG TABS tablet Take 1 tablet (20 mg total) by mouth daily with supper. 90 tablet 3   No current facility-administered medications for this visit.    Allergies:   Patient has no known allergies.    ROS:  Please see the history of present illness.   Otherwise, review of systems are positive for none.   All other systems are reviewed and negative.    PHYSICAL EXAM: VS:  BP 116/78   Pulse 63   Ht 5\' 9"  (1.753 m)   Wt 188 lb 12.8 oz (85.6 kg)   SpO2 99%   BMI 27.88 kg/m  , BMI Body mass index is 27.88 kg/m. GENERAL:  Well appearing NECK:  No jugular venous distention, waveform within normal limits, carotid upstroke brisk and symmetric, no bruits, no thyromegaly LUNGS:  Clear to auscultation bilaterally CHEST:  Unremarkable HEART:  PMI not displaced  or sustained,S1 and S2 within normal limits, no S3,  no clicks, no rubs, non murmurs, irregular  ABD:  Flat, positive bowel sounds normal in frequency in pitch, no bruits, no rebound, no guarding, no midline pulsatile mass, no hepatomegaly, no splenomegaly EXT:  2 plus pulses throughout, no edema, no cyanosis no clubbing   EKG:  EKG is   ordered today. The ekg ordered today demonstrates atrial fibrillation, rate 63, left axis deviation, lateral T wave inversions less pronounced than previous   Recent Labs: 10/29/2019: ALT 32; BUN 17; Creat 0.88; Hemoglobin 14.9; Platelets 275; Potassium 4.5; Sodium 138 12/14/2019: TSH 1.790    Lipid Panel    Component Value Date/Time   CHOL 224 (H) 10/29/2019 1232   TRIG 51 10/29/2019 1232   HDL 99 10/29/2019 1232   CHOLHDL 2.3 10/29/2019 1232   VLDL 9 12/04/2015 0911   LDLCALC 112 (H) 10/29/2019 1232   LDLDIRECT 101.2 09/03/2009 0000      Wt Readings from Last 3 Encounters:  01/08/20 188 lb 12.8 oz (85.6 kg)  12/14/19 188 lb 6.4 oz (85.5 kg)  11/01/19 185 lb (83.9 kg)      Other studies Reviewed: Additional studies/ records that were  reviewed today include: Echo, CT Review of the above records demonstrates:  Please see elsewhere in the note.     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  He has a slow ventricular rate and really does not notice this. Grant Griffin has a CHA2DS2 - VASc score of 1.  As previously planned he is going to continue anticoagulation.  I will make plans about anticoagulation long-term after seeing him back  MR: He seems to have annular dilatation although he might have a click.  There is no evidence of prolapse.  I will follow this with an echocardiogram in 1 year.  ABNORMAL EKG:  He has elevated coronary calcium.  He will have POET (Plain Old Exercise Treadmill) following his cardioversion.  COVID EDUCATION: He has had both of his vaccines.  Current medicines are reviewed at length with the  patient today.  The patient does not have concerns regarding medicines.  The following changes have been made:  None  Labs/ tests ordered today include:   Orders Placed This Encounter  Procedures  . EXERCISE TOLERANCE TEST (ETT)     Disposition:   FU with me after the POET (Plain Old Exercise Treadmill)   Signed, Minus Breeding, MD  01/08/2020 5:16 PM    [place]

## 2020-01-08 ENCOUNTER — Other Ambulatory Visit: Payer: Self-pay

## 2020-01-08 ENCOUNTER — Other Ambulatory Visit (HOSPITAL_COMMUNITY)
Admission: RE | Admit: 2020-01-08 | Discharge: 2020-01-08 | Disposition: A | Discharge status: Home / Self Care | Payer: PPO | Source: Ambulatory Visit | Attending: Internal Medicine | Admitting: Internal Medicine

## 2020-01-08 ENCOUNTER — Ambulatory Visit: Payer: PPO | Admitting: Cardiology

## 2020-01-08 ENCOUNTER — Encounter: Payer: Self-pay | Admitting: Cardiology

## 2020-01-08 VITALS — BP 116/78 | HR 63 | Ht 69.0 in | Wt 188.8 lb

## 2020-01-08 DIAGNOSIS — R931 Abnormal findings on diagnostic imaging of heart and coronary circulation: Secondary | ICD-10-CM

## 2020-01-08 DIAGNOSIS — I340 Nonrheumatic mitral (valve) insufficiency: Secondary | ICD-10-CM | POA: Diagnosis not present

## 2020-01-08 DIAGNOSIS — Z01812 Encounter for preprocedural laboratory examination: Secondary | ICD-10-CM | POA: Diagnosis not present

## 2020-01-08 DIAGNOSIS — I4891 Unspecified atrial fibrillation: Secondary | ICD-10-CM | POA: Diagnosis not present

## 2020-01-08 DIAGNOSIS — I4819 Other persistent atrial fibrillation: Secondary | ICD-10-CM

## 2020-01-08 DIAGNOSIS — Z20822 Contact with and (suspected) exposure to covid-19: Secondary | ICD-10-CM | POA: Diagnosis not present

## 2020-01-08 DIAGNOSIS — R9431 Abnormal electrocardiogram [ECG] [EKG]: Secondary | ICD-10-CM | POA: Diagnosis not present

## 2020-01-08 LAB — SARS CORONAVIRUS 2 (TAT 6-24 HRS): SARS Coronavirus 2: NEGATIVE

## 2020-01-08 NOTE — Patient Instructions (Addendum)
Medication Instructions:  NO CHANGES *If you need a refill on your cardiac medications before your next appointment, please call your pharmacy*  Lab Work: NONE ORDERED THIS VISIT  Testing/Procedures: Your physician has requested that you have an exercise tolerance test IN ONE MONTH. For further information please visit [email or web address]. Please also follow instruction sheet, as given.   You will need a covid screening 3 days before your stress test.This is a Drive Up Visit at the [facility] [address], [place]. Someone will direct you to the appropriate testing line. Stay in your car and someone will be with you shortly  Follow-Up: At [facility], you and your health needs are our priority.  As part of our continuing mission to provide you with exceptional heart care, we have created designated Provider Care Teams.  These Care Teams include your primary Cardiologist (physician) and Advanced Practice Providers (APPs -  Physician Assistants and Nurse Practitioners) who all work together to provide you with the care you need, when you need it.  Your next appointment:   2 month(s)  The format for your next appointment:   In Person  Provider:   Minus Breeding, MD  Other Instructions [place] 2 WEEKS TO COMPLETE AN EKG.

## 2020-01-09 LAB — CBC
Hematocrit: 42.6 % (ref 37.5–51.0)
Hemoglobin: 14.4 g/dL (ref 13.0–17.7)
MCH: 29.6 pg (ref 26.6–33.0)
MCHC: 33.8 g/dL (ref 31.5–35.7)
MCV: 88 fL (ref 79–97)
Platelets: 259 10*3/uL (ref 150–450)
RBC: 4.86 x10E6/uL (ref 4.14–5.80)
RDW: 13.2 % (ref 11.6–15.4)
WBC: 4.5 10*3/uL (ref 3.4–10.8)

## 2020-01-09 LAB — BASIC METABOLIC PANEL
BUN/Creatinine Ratio: 22 (ref 10–24)
BUN: 21 mg/dL (ref 8–27)
CO2: 26 mmol/L (ref 20–29)
Calcium: 9.6 mg/dL (ref 8.6–10.2)
Chloride: 105 mmol/L (ref 96–106)
Creatinine, Ser: 0.97 mg/dL (ref 0.76–1.27)
GFR calc Af Amer: 91 mL/min/{1.73_m2} (ref >59)
GFR calc non Af Amer: 79 mL/min/{1.73_m2} (ref >59)
Glucose: 91 mg/dL (ref 65–99)
Potassium: 4.9 mmol/L (ref 3.5–5.2)
Sodium: 144 mmol/L (ref 134–144)

## 2020-01-11 ENCOUNTER — Ambulatory Visit (HOSPITAL_COMMUNITY)
Admission: RE | Admit: 2020-01-11 | Discharge: 2020-01-11 | Disposition: A | Discharge status: Home / Self Care | Payer: PPO | Attending: Internal Medicine | Admitting: Internal Medicine

## 2020-01-11 ENCOUNTER — Encounter (HOSPITAL_COMMUNITY): Payer: Self-pay | Admitting: Internal Medicine

## 2020-01-11 ENCOUNTER — Encounter (HOSPITAL_COMMUNITY)
Admission: RE | Disposition: A | Discharge status: Home / Self Care | Payer: PPO | Source: Home / Self Care | Attending: Internal Medicine

## 2020-01-11 ENCOUNTER — Ambulatory Visit (HOSPITAL_COMMUNITY): Payer: PPO | Admitting: Certified Registered"

## 2020-01-11 ENCOUNTER — Other Ambulatory Visit: Payer: Self-pay

## 2020-01-11 ENCOUNTER — Other Ambulatory Visit (INDEPENDENT_AMBULATORY_CARE_PROVIDER_SITE_OTHER): Payer: PPO

## 2020-01-11 DIAGNOSIS — I340 Nonrheumatic mitral (valve) insufficiency: Secondary | ICD-10-CM | POA: Diagnosis not present

## 2020-01-11 DIAGNOSIS — Z7901 Long term (current) use of anticoagulants: Secondary | ICD-10-CM | POA: Insufficient documentation

## 2020-01-11 DIAGNOSIS — I2510 Atherosclerotic heart disease of native coronary artery without angina pectoris: Secondary | ICD-10-CM | POA: Diagnosis not present

## 2020-01-11 DIAGNOSIS — R9431 Abnormal electrocardiogram [ECG] [EKG]: Secondary | ICD-10-CM | POA: Diagnosis not present

## 2020-01-11 DIAGNOSIS — I10 Essential (primary) hypertension: Secondary | ICD-10-CM

## 2020-01-11 DIAGNOSIS — M1990 Unspecified osteoarthritis, unspecified site: Secondary | ICD-10-CM | POA: Diagnosis not present

## 2020-01-11 DIAGNOSIS — I4891 Unspecified atrial fibrillation: Secondary | ICD-10-CM

## 2020-01-11 DIAGNOSIS — I7300 Raynaud's syndrome without gangrene: Secondary | ICD-10-CM | POA: Diagnosis not present

## 2020-01-11 DIAGNOSIS — I4819 Other persistent atrial fibrillation: Secondary | ICD-10-CM | POA: Diagnosis not present

## 2020-01-11 HISTORY — PX: CARDIOVERSION: SHX1299

## 2020-01-11 SURGERY — CARDIOVERSION
Anesthesia: General

## 2020-01-11 MED ORDER — PROPOFOL 10 MG/ML IV BOLUS
INTRAVENOUS | Status: DC | PRN
  Administered 2020-01-11: 20 mg via INTRAVENOUS
  Administered 2020-01-11: 60 mg via INTRAVENOUS
  Administered 2020-01-11: 20 mg via INTRAVENOUS

## 2020-01-11 MED ORDER — LIDOCAINE HCL (CARDIAC) PF 100 MG/5ML IV SOSY
PREFILLED_SYRINGE | INTRAVENOUS | Status: DC | PRN
  Administered 2020-01-11: 40 mg via INTRAVENOUS

## 2020-01-11 MED ORDER — SODIUM CHLORIDE 0.9 % IV SOLN
INTRAVENOUS | Status: DC
  Administered 2020-01-11: 500 mL via INTRAVENOUS

## 2020-01-11 NOTE — CV Procedure (Signed)
CARDIOVERSION  Pt anesthetized by anesthesia with Propofol and lidocaine intravenously  With pads in AP position, patient cardioverted to SR with 200 J synchronized biphasic energy  Procedure was without complication  12 Lead EKG pending   Dorris Carnes MD

## 2020-01-11 NOTE — Discharge Instructions (Signed)
Electrical Cardioversion   What can I expect after the procedure?  Your blood pressure, heart rate, breathing rate, and blood oxygen level will be monitored until you leave the hospital or clinic.  Your heart rhythm will be watched to make sure it does not change.  You may have some redness on the skin where the shocks were given. Follow these instructions at home:  Do not drive for 24 hours if you were given a sedative during your procedure.  Take over-the-counter and prescription medicines only as told by your health care provider.  Ask your health care provider how to check your pulse. Check it often.  Rest for 48 hours after the procedure or as told by your health care provider.  Avoid or limit your caffeine use as told by your health care provider.  Keep all follow-up visits as told by your health care provider. This is important. Contact a health care provider if:  You feel like your heart is beating too quickly or your pulse is not regular.  You have a serious muscle cramp that does not go away. Get help right away if:  You have discomfort in your chest.  You are dizzy or you feel faint.  You have trouble breathing or you are short of breath.  Your speech is slurred.  You have trouble moving an arm or leg on one side of your body.  Your fingers or toes turn cold or blue. Summary  Electrical cardioversion is the delivery of a jolt of electricity to restore a normal rhythm to the heart.  This procedure may be done right away in an emergency or may be a scheduled procedure if the condition is not an emergency.  Generally, this is a safe procedure.  After the procedure, check your pulse often as told by your health care provider. This information is not intended to replace advice given to you by your health care provider. Make sure you discuss any questions you have with your health care provider. Document Revised: 03/26/2019 Document Reviewed:  03/26/2019 Elsevier Patient Education  2020 [employer]  

## 2020-01-11 NOTE — Anesthesia Preprocedure Evaluation (Addendum)
Anesthesia Evaluation  Patient identified by MRN, date of birth, ID band Patient awake    Reviewed: Allergy & Precautions, NPO status , Patient's Chart, lab work & pertinent test results  Airway Mallampati: II  TM Distance: >3 FB Neck ROM: Full    Dental no notable dental hx. (+) Teeth Intact, Dental Advisory Given   Pulmonary neg pulmonary ROS,    Pulmonary exam normal        Cardiovascular + CAD  + dysrhythmias Atrial Fibrillation  Rhythm:Irregular Rate:Normal     Neuro/Psych negative psych ROS   GI/Hepatic negative GI ROS, Neg liver ROS,   Endo/Other  negative endocrine ROS  Renal/GU negative Renal ROS     Musculoskeletal  (+) Arthritis ,   Abdominal Normal abdominal exam  (+)   Peds  Hematology   Anesthesia Other Findings   Reproductive/Obstetrics                            Anesthesia Physical Anesthesia Plan  ASA: III  Anesthesia Plan: General   Post-op Pain Management:    Induction: Intravenous  PONV Risk Score and Plan: 0 and Treatment may vary due to age or medical condition  Airway Management Planned: Simple Face Mask and Natural Airway  Additional Equipment: None  Intra-op Plan:   Post-operative Plan:   Informed Consent: I have reviewed the patients History and Physical, chart, labs and discussed the procedure including the risks, benefits and alternatives for the proposed anesthesia with the patient or authorized representative who has indicated his/her understanding and acceptance.       Plan Discussed with: CRNA  Anesthesia Plan Comments: (Echo:  1. Left ventricular ejection fraction, by estimation, is 55 to 60%. The  left ventricle has normal function. The left ventricle has no regional  wall motion abnormalities. Left ventricular diastolic function could not  be evaluated.  2. Right ventricular systolic function is normal. The right ventricular  size  is mildly enlarged. There is normal pulmonary artery systolic  pressure. The estimated right ventricular systolic pressure is 0000000 mmHg.  3. Left atrial size was moderately dilated.  4. Right atrial size was moderately dilated.  5. Moderate mitral regurgitation that is likely atrial functional due to  annular dilation from atrial fibrillation. ERO 0.2 cm2. Rvol 32 cc. The  mitral valve is grossly normal. Moderate mitral valve regurgitation.  6. The aortic valve is tricuspid. Aortic valve regurgitation is not  visualized. No aortic stenosis is present.  7. The inferior vena cava is normal in size with greater than 50%  respiratory variability, suggesting right atrial pressure of 3 mmHg. )       Anesthesia Quick Evaluation

## 2020-01-11 NOTE — Anesthesia Postprocedure Evaluation (Signed)
Anesthesia Post Note  Patient: ARLO HOAR  Procedure(s) Performed: CARDIOVERSION (N/A )     Patient location during evaluation: PACU Anesthesia Type: General Level of consciousness: awake and alert Pain management: pain level controlled Vital Signs Assessment: post-procedure vital signs reviewed and stable Respiratory status: spontaneous breathing, nonlabored ventilation, respiratory function stable and patient connected to nasal cannula oxygen Cardiovascular status: blood pressure returned to baseline and stable Postop Assessment: no apparent nausea or vomiting Anesthetic complications: no    Last Vitals:  Vitals:   01/11/20 0800 01/11/20 0810  BP: 107/64 117/77  Pulse: (!) 55   Resp: 17 19  Temp:    SpO2: 96% 97%    Last Pain:  Vitals:   01/11/20 0810  TempSrc:   PainSc: 0-No pain                 Effie Berkshire

## 2020-01-11 NOTE — Transfer of Care (Signed)
Immediate Anesthesia Transfer of Care Note  Patient: Grant Griffin  Procedure(s) Performed: CARDIOVERSION (N/A )  Patient Location: Endoscopy Unit  Anesthesia Type:General  Level of Consciousness: awake, alert , oriented and drowsy  Airway & Oxygen Therapy: Patient Spontanous Breathing  Post-op Assessment: Report given to RN, Post -op Vital signs reviewed and stable and Patient moving all extremities X 4  Post vital signs: Reviewed and stable  Last Vitals:  Vitals Value Taken Time  BP    Temp    Pulse    Resp    SpO2      Last Pain:  Vitals:   01/11/20 0700  TempSrc: Axillary  PainSc: 0-No pain         Complications: No apparent anesthesia complications

## 2020-01-11 NOTE — Interval H&P Note (Signed)
History and Physical Interval Note:  01/11/2020 7:00 AM  Grant Griffin  has presented today for surgery, with the diagnosis of AFIB.  The various methods of treatment have been discussed with the patient and family. After consideration of risks, benefits and other options for treatment, the patient has consented to  Procedure(s): CARDIOVERSION (N/A) as a surgical intervention.  The patient's history has been reviewed, patient examined, no change in status, stable for surgery.  I have reviewed the patient's chart and labs.  Questions were answered to the patient's satisfaction.     Dorris Carnes

## 2020-01-11 NOTE — Interval H&P Note (Signed)
History and Physical Interval Note:  01/11/2020 6:58 AM  Grant Griffin  has presented today for surgery, with the diagnosis of AFIB.  The various methods of treatment have been discussed with the patient and family. After consideration of risks, benefits and other options for treatment, the patient has consented to  Procedure(s): CARDIOVERSION (N/A) as a surgical intervention.  The patient's history has been reviewed, patient examined, no change in status, stable for surgery.  I have reviewed the patient's chart and labs.  Questions were answered to the patient's satisfaction.     Dorris Carnes

## 2020-01-18 ENCOUNTER — Telehealth: Payer: Self-pay | Admitting: Cardiology

## 2020-01-18 NOTE — Telephone Encounter (Signed)
Called no answer . Left message to call back  Ask for ttirage

## 2020-01-18 NOTE — Telephone Encounter (Signed)
[name] is calling stating Grant Griffin advised him to call in and schedule an EKG with the nurse. Please advise.

## 2020-01-18 NOTE — Telephone Encounter (Signed)
Called and spoke with pt. States he needs an EKG 2 weeks out from his cardioversion which was last Friday. Pt would like to have this done 01/25/20 at 11:00 am. Notified this was fine and we would get him scheduled in. No other questions at this time.

## 2020-01-23 ENCOUNTER — Ambulatory Visit: Payer: PPO | Admitting: Cardiology

## 2020-01-24 ENCOUNTER — Ambulatory Visit: Payer: PPO | Admitting: Cardiology

## 2020-01-25 ENCOUNTER — Ambulatory Visit: Payer: PPO

## 2020-01-25 ENCOUNTER — Other Ambulatory Visit: Payer: Self-pay

## 2020-01-25 VITALS — BP 114/72 | HR 65

## 2020-01-25 DIAGNOSIS — I4891 Unspecified atrial fibrillation: Secondary | ICD-10-CM

## 2020-01-25 NOTE — Progress Notes (Signed)
1.) Reason for visit: Post cardioversion EKG  2.) Name of MD requesting visit: Dr. Percival Spanish  3.) Assessment and plan per MD: EKG shown to Dr. Percival Spanish. Patient scheduled for next available appointment in office (01/30/20 with Kerin Ransom) due to patient having converted back to atrial fibrillation after being cardioverted.

## 2020-01-28 DIAGNOSIS — L821 Other seborrheic keratosis: Secondary | ICD-10-CM | POA: Diagnosis not present

## 2020-01-28 DIAGNOSIS — D225 Melanocytic nevi of trunk: Secondary | ICD-10-CM | POA: Diagnosis not present

## 2020-01-28 DIAGNOSIS — D485 Neoplasm of uncertain behavior of skin: Secondary | ICD-10-CM | POA: Diagnosis not present

## 2020-01-28 DIAGNOSIS — L814 Other melanin hyperpigmentation: Secondary | ICD-10-CM | POA: Diagnosis not present

## 2020-01-28 DIAGNOSIS — C44619 Basal cell carcinoma of skin of left upper limb, including shoulder: Secondary | ICD-10-CM | POA: Diagnosis not present

## 2020-01-28 DIAGNOSIS — Z85828 Personal history of other malignant neoplasm of skin: Secondary | ICD-10-CM | POA: Diagnosis not present

## 2020-01-28 DIAGNOSIS — B078 Other viral warts: Secondary | ICD-10-CM | POA: Diagnosis not present

## 2020-01-28 DIAGNOSIS — L720 Epidermal cyst: Secondary | ICD-10-CM | POA: Diagnosis not present

## 2020-01-28 DIAGNOSIS — L570 Actinic keratosis: Secondary | ICD-10-CM | POA: Diagnosis not present

## 2020-01-30 ENCOUNTER — Telehealth: Payer: Self-pay | Admitting: Cardiology

## 2020-01-30 ENCOUNTER — Other Ambulatory Visit: Payer: Self-pay

## 2020-01-30 ENCOUNTER — Ambulatory Visit (INDEPENDENT_AMBULATORY_CARE_PROVIDER_SITE_OTHER): Payer: PPO | Admitting: Cardiology

## 2020-01-30 ENCOUNTER — Encounter: Payer: Self-pay | Admitting: Cardiology

## 2020-01-30 DIAGNOSIS — Z7901 Long term (current) use of anticoagulants: Secondary | ICD-10-CM

## 2020-01-30 DIAGNOSIS — I340 Nonrheumatic mitral (valve) insufficiency: Secondary | ICD-10-CM

## 2020-01-30 DIAGNOSIS — R931 Abnormal findings on diagnostic imaging of heart and coronary circulation: Secondary | ICD-10-CM

## 2020-01-30 DIAGNOSIS — I4819 Other persistent atrial fibrillation: Secondary | ICD-10-CM | POA: Diagnosis not present

## 2020-01-30 NOTE — Assessment & Plan Note (Signed)
Nuclear stress ordered

## 2020-01-30 NOTE — Telephone Encounter (Signed)
I called the patient to discuss Dr Hochrein's recommendations for a coronary CT scan as opposed to a nuclear stress. Message left on his voicemail.  Kerin Ransom PA-C 01/30/2020 4:33 PM

## 2020-01-30 NOTE — Progress Notes (Signed)
Thanks agree.

## 2020-01-30 NOTE — Progress Notes (Addendum)
Cardiology Office Note:    Date:  01/30/2020   ID:  Grant Griffin, DOB 09/26/1950, MRN [id number]  PCP:  Grant Showers, MD  Cardiologist:  Grant Breeding, MD  Electrophysiologist:  None   Referring MD: Grant Showers, MD   CC: follow up post DCCV  History of Present Illness:    Grant Griffin is a 69 y.o. male with a hx of asymptomatic PAF.  This was noted when he went for a colonoscopy.  He denies any unusual chest pain or dyspnea or palpitations.  He was referred to Dr. Percival Griffin for further evaluation.  Coronary CT scoring showed a high calcium score of 110 with calcium noted in the LAD.  Echocardiogram showed preserved LV function with moderate MR.  The patient was placed on Xarelto and Dr. Percival Griffin recommended outpatient cardioversion.  This was done 01/11/2020 and was successful.  He presents to the office today for further evaluation.  Because of his high calcium score Dr. Percival Griffin is arranged for him to have an outpatient nuclear stress test.  The plan was to continue his anticoagulation until Dr. Percival Griffin could discuss this further with him.  By my evaluation the patient has a chads vas score of 2 based on his age and vascular disease noted on his CT scan.  The patient remains asymptomatic.  He is in atrial fibrillation today with a ventricular response of 53.  We discussed potential options going foward including doing nothing since he is asymptomatic versus EP referral to consider antiarrhythmic therapy and repeat cardioversion.  He seems to be a young 69 years old for amiodarone and he may not tolerate this as he is already bradycardic.  Sotalol would also not be an option.  That leaves Tikosyn.  I suggested we may want to see what his stress test shows as that may also impact Dr. Rosezella Griffin decision.  Past Medical History:  Diagnosis Date  . Raynaud's disease     Past Surgical History:  Procedure Laterality Date  . CARDIOVERSION N/A 01/11/2020   Procedure: CARDIOVERSION;  Surgeon:  Grant Records, MD;  [facility] END[facility]COPY;  Service: Cardiovascular;  Laterality: N/A;  . COLONOSCOPY    . ROTATOR CUFF REPAIR Left 2004    Current Medications: Current Meds  Medication Sig  . rivaroxaban (XARELTO) 20 MG TABS tablet Take 1 tablet (20 mg total) by mouth daily with supper.     Allergies:   Patient has no known allergies.   Social History   Socioeconomic History  . Marital status: Married    Spouse name: Not on file  . Number of children: Not on file  . Years of education: Not on file  . Highest education level: Not on file  Occupational History  . Not on file  Tobacco Use  . Smoking status: Never Smoker  . Smokeless tobacco: Never Used  Substance and Sexual Activity  . Alcohol use: Yes    Comment: socially  . Drug use: No  . Sexual activity: Not on file  Other Topics Concern  . Not on file  Social History Narrative   Married.  Two children.  Retired Pharmacist, community.     Investment banker, operational of Radio broadcast assistant Strain:   . Difficulty of Paying Living Expenses:   Food Insecurity:   . Worried About Charity fundraiser in the Last Year:   . Arboriculturist in the Last Year:   Transportation Needs:   . Film/video editor (Medical):   Grant Griffin  Lack of Transportation (Non-Medical):   Physical Activity:   . Days of Exercise per Week:   . Minutes of Exercise per Session:   Stress:   . Feeling of Stress :   Social Connections:   . Frequency of Communication with Friends and Family:   . Frequency of Social Gatherings with Friends and Family:   . Attends Religious Services:   . Active Member of Clubs or Organizations:   . Attends Archivist Meetings:   Grant Griffin Marital Status:      Family History: The patient's family history includes Heart disease (age of onset: 3) in his father. There is no history of Colon cancer, Esophageal cancer, Stomach cancer, or Rectal cancer.  ROS:   Please see the history of present illness.     All other systems  reviewed and are negative.  EKGs/Labs/Other Studies Reviewed:    The following studies were reviewed today: DDCV 01/11/2020  EKG:  EKG is ordered today.  The ekg ordered today demonstrates AF with VR 53 QTc 418  Recent Labs: 10/29/2019: ALT 32 12/14/2019: TSH 1.790 01/08/2020: BUN 21; Creatinine, Ser 0.97; Hemoglobin 14.4; Platelets 259; Potassium 4.9; Sodium 144  Recent Lipid Panel    Component Value Date/Time   CHOL 224 (H) 10/29/2019 1232   TRIG 51 10/29/2019 1232   HDL 99 10/29/2019 1232   CHOLHDL 2.3 10/29/2019 1232   VLDL 9 12/04/2015 0911   LDLCALC 112 (H) 10/29/2019 1232   LDLDIRECT 101.2 09/03/2009 0000    Physical Exam:    VS:  BP 96/70 (BP Location: Left Arm, Patient Position: Sitting, Cuff Size: Normal)   Pulse 64   Temp (!) 97.3 F (36.3 C)   Ht 5\' 9"  (1.753 m)   Wt 189 lb (85.7 kg)   BMI 27.91 kg/m     Wt Readings from Last 3 Encounters:  01/30/20 189 lb (85.7 kg)  01/11/20 188 lb (85.3 kg)  01/08/20 188 lb 12.8 oz (85.6 kg)     GEN:  Well nourished, well developed in no acute distress HEENT: Normal NECK: No JVD; No carotid bruits CARDIAC: irregularly irregular, no murmurs, rubs, gallops RESPIRATORY:  Clear to auscultation without rales, wheezing or rhonchi  ABDOMEN: Soft, non-tender, non-distended MUSCULOSKELETAL:  No edema; No deformity  SKIN: Warm and dry NEUROLOGIC:  Alert and oriented x 3 PSYCHIATRIC:  Normal affect   ASSESSMENT:    Persistent atrial fibrillation ([place]) S/P DCCV 01/11/2020- recurrent PAF today in the office- asymptomatic.  Nonrheumatic mitral valve regurgitation Moderate MR noted in April 2021 when he was in AF  Elevated coronary artery calcium score Nuclear stress ordered  Anticoagulated CHADS VASC= (?) 2 secondary to age and vascular disease noted on Ca++ scoring CT. Continue Xarelto -   PLAN:    Will review with Dr Grant Griffin- ? EP evaluation - he is asymptomatic and the duration of his atrial fibrillation is unknown.   MR is concerning.  ? Tikosyn vs rate control, either way I suspect he will need long term anticoagulation.    Medication Adjustments/Labs and Tests Ordered: Current medicines are reviewed at length with the patient today.  Concerns regarding medicines are outlined above.  No orders of the defined types were placed in this encounter.  No orders of the defined types were placed in this encounter.   Patient Instructions  Medication Instructions:  Your physician recommends that you continue on your current medications as directed. Please refer to the Current Medication list given to you today.  *If  you need a refill on your cardiac medications before your next appointment, please call your pharmacy*   Follow-Up: At [facility], you and your health needs are our priority.  As part of our continuing mission to provide you with exceptional heart care, we have created designated Provider Care Teams.  These Care Teams include your primary Cardiologist (physician) and Advanced Practice Providers (APPs -  Physician Assistants and Nurse Practitioners) who all work together to provide you with the care you need, when you need it.  We recommend signing up for the patient portal called "MyChart".  Sign up information is provided on this After Visit Summary.  MyChart is used to connect with patients for Virtual Visits (Telemedicine).  Patients are able to view lab/test results, encounter notes, upcoming appointments, etc.  Non-urgent messages can be sent to your provider as well.   To learn more about what you can do with MyChart, go to [email or web address].    Your next appointment:   Please keep your scheduled follow-up appointments.     Angelena Form, PA-C  01/30/2020 12:14 PM    [facility]

## 2020-01-30 NOTE — Progress Notes (Signed)
Grant Griffin, They can scan this patient, he has paroxysmal atrial fibrillation, if he is in A. fib today of acquisition we can scan his retrospective scan.  Just make sure that patient is beta-blockade well for heart rate control.  In this case I would use metoprolol 50 mg 2 hours prior to his scan. Thank you, Houston Siren

## 2020-01-30 NOTE — Assessment & Plan Note (Signed)
Moderate MR noted in April 2021 when he was in AF

## 2020-01-30 NOTE — Assessment & Plan Note (Addendum)
S/P DCCV 01/11/2020- recurrent PAF today in the office- asymptomatic.

## 2020-01-30 NOTE — Assessment & Plan Note (Signed)
CHADS VASC= (?) 2 secondary to age and vascular disease noted on Ca++ scoring CT. Continue Xarelto -

## 2020-01-30 NOTE — Patient Instructions (Signed)
Medication Instructions:  Your physician recommends that you continue on your current medications as directed. Please refer to the Current Medication list given to you today.  *If you need a refill on your cardiac medications before your next appointment, please call your pharmacy*   Follow-Up: At [facility], you and your health needs are our priority.  As part of our continuing mission to provide you with exceptional heart care, we have created designated Provider Care Teams.  These Care Teams include your primary Cardiologist (physician) and Advanced Practice Providers (APPs -  Physician Assistants and Nurse Practitioners) who all work together to provide you with the care you need, when you need it.  We recommend signing up for the patient portal called "MyChart".  Sign up information is provided on this After Visit Summary.  MyChart is used to connect with patients for Virtual Visits (Telemedicine).  Patients are able to view lab/test results, encounter notes, upcoming appointments, etc.  Non-urgent messages can be sent to your provider as well.   To learn more about what you can do with MyChart, go to [email or web address].    Your next appointment:   Please keep your scheduled follow-up appointments.

## 2020-01-31 ENCOUNTER — Telehealth: Payer: Self-pay | Admitting: *Deleted

## 2020-01-31 DIAGNOSIS — R9431 Abnormal electrocardiogram [ECG] [EKG]: Secondary | ICD-10-CM

## 2020-01-31 DIAGNOSIS — I4819 Other persistent atrial fibrillation: Secondary | ICD-10-CM

## 2020-01-31 NOTE — Telephone Encounter (Deleted)
-----   Message from Erlene Quan, [place] sent at 01/30/2020  4:33 PM EDT ----- Angie Fava this patient needs a coronary CT per Dr Percival Spanish.  You can cancel the Nuclear stress he has ordered.  The reason is persistent atrial fibrillation and abnormal EKG.    Thanks  Kerin Ransom PA-C 01/30/2020 4:35 PM

## 2020-01-31 NOTE — Telephone Encounter (Signed)
-----   Message from Erlene Quan, [place] sent at 01/30/2020  4:33 PM EDT ----- Angie Fava this patient needs a coronary CT per Dr Percival Spanish.  You can cancel the Nuclear stress he has ordered.  The reason is persistent atrial fibrillation and abnormal EKG.    Thanks  Kerin Ransom PA-C 01/30/2020 4:35 PM

## 2020-02-05 ENCOUNTER — Encounter: Payer: Self-pay | Admitting: *Deleted

## 2020-02-05 MED ORDER — METOPROLOL TARTRATE 50 MG PO TABS
ORAL_TABLET | ORAL | 0 refills | Status: DC
Start: 2020-02-05 — End: 2020-02-27

## 2020-02-05 NOTE — Telephone Encounter (Signed)
Coronary CT ordered placed.    Instructions sent via Mychart   Spoke to patient, aware and verbalized understanding.

## 2020-02-06 ENCOUNTER — Other Ambulatory Visit (INDEPENDENT_AMBULATORY_CARE_PROVIDER_SITE_OTHER): Payer: PPO

## 2020-02-06 DIAGNOSIS — I10 Essential (primary) hypertension: Secondary | ICD-10-CM | POA: Diagnosis not present

## 2020-02-12 ENCOUNTER — Other Ambulatory Visit (HOSPITAL_COMMUNITY): Payer: PPO

## 2020-02-13 NOTE — Addendum Note (Signed)
Addended by: Therisa Doyne on: 02/13/2020 04:03 PM   Modules accepted: Orders

## 2020-02-15 ENCOUNTER — Encounter (HOSPITAL_COMMUNITY): Payer: PPO

## 2020-02-18 ENCOUNTER — Other Ambulatory Visit (HOSPITAL_COMMUNITY): Payer: PPO

## 2020-02-21 ENCOUNTER — Inpatient Hospital Stay (HOSPITAL_COMMUNITY): Admission: RE | Admit: 2020-02-21 | Payer: PPO | Source: Ambulatory Visit

## 2020-02-26 ENCOUNTER — Telehealth (HOSPITAL_COMMUNITY): Payer: Self-pay | Admitting: *Deleted

## 2020-02-26 ENCOUNTER — Other Ambulatory Visit: Payer: Self-pay | Admitting: Cardiology

## 2020-02-26 ENCOUNTER — Other Ambulatory Visit: Payer: Self-pay | Admitting: *Deleted

## 2020-02-26 DIAGNOSIS — R9431 Abnormal electrocardiogram [ECG] [EKG]: Secondary | ICD-10-CM | POA: Diagnosis not present

## 2020-02-26 DIAGNOSIS — R931 Abnormal findings on diagnostic imaging of heart and coronary circulation: Secondary | ICD-10-CM

## 2020-02-26 NOTE — Telephone Encounter (Signed)
Attempted to call patient regarding upcoming cardiac CT appointment. °Left message on voicemail with name and callback number ° °Addasyn Mcbreen Tai RN Navigator Cardiac Imaging °[facility] °[phone] Office °[phone] Cell ° °

## 2020-02-27 ENCOUNTER — Other Ambulatory Visit: Payer: Self-pay | Admitting: *Deleted

## 2020-02-27 ENCOUNTER — Ambulatory Visit (HOSPITAL_COMMUNITY)
Admission: RE | Admit: 2020-02-27 | Discharge: 2020-02-27 | Disposition: A | Discharge status: Home / Self Care | Payer: PPO | Source: Ambulatory Visit | Attending: Cardiology | Admitting: Cardiology

## 2020-02-27 ENCOUNTER — Other Ambulatory Visit: Payer: Self-pay

## 2020-02-27 DIAGNOSIS — R9431 Abnormal electrocardiogram [ECG] [EKG]: Secondary | ICD-10-CM | POA: Diagnosis not present

## 2020-02-27 DIAGNOSIS — I4819 Other persistent atrial fibrillation: Secondary | ICD-10-CM | POA: Diagnosis not present

## 2020-02-27 LAB — POCT I-STAT CREATININE: Creatinine, Ser: 0.9 mg/dL (ref 0.61–1.24)

## 2020-02-27 MED ORDER — NITROGLYCERIN 0.4 MG SL SUBL
SUBLINGUAL_TABLET | SUBLINGUAL | Status: AC
  Filled 2020-02-27: qty 2

## 2020-02-27 MED ORDER — NITROGLYCERIN 0.4 MG SL SUBL
0.8000 mg | SUBLINGUAL_TABLET | Freq: Once | SUBLINGUAL | Status: AC
  Administered 2020-02-27: 0.8 mg via SUBLINGUAL

## 2020-02-27 MED ORDER — METOPROLOL TARTRATE 50 MG PO TABS: ORAL_TABLET | ORAL | 0 refills | Status: DC

## 2020-02-27 MED ORDER — IOHEXOL 350 MG/ML SOLN
80.0000 mL | Freq: Once | INTRAVENOUS | Status: AC | PRN
  Administered 2020-02-27: 150 mL via INTRAVENOUS

## 2020-03-06 ENCOUNTER — Telehealth: Payer: Self-pay | Admitting: Adult Health

## 2020-03-06 NOTE — Telephone Encounter (Signed)
Transferred call to Nia.

## 2020-03-09 NOTE — Progress Notes (Signed)
Cardiology Office Note  Already Date:  03/11/2020   ID:  Grant Griffin, DOB Jan 19, 1951, MRN [id number]  PCP:  Elby Showers, MD  Cardiologist:   Minus Breeding, MD Referring:  Elby Showers, MD  Chief Complaint  Patient presents with  . Atrial Fibrillation      History of Present Illness: Grant Griffin is a 69 y.o. male who is referred by Elby Showers, MD for evaluation of atrial fib and an abnormal EKG.  At the last visit I set him up for a DCCV.  I sent him for an echo which showed moderate MR.   He had an elevated coronary calcium as well on CT with a score of 110 which was 45% for his age and gender.  This demonstrated 25 to 49% proximal LAD stenosis.  He had cardioversion on 01/11/2020 and was in sinus rhythm following this.  However, he was back in atrial fibrillation on follow-up in late May.    Since I last saw him he has felt well.  He denies any cardiovascular symptoms.  He is swimming.  He might have a little shortness of breath running up the stairs.  He does not notice his fibrillation The patient denies any new symptoms such as chest discomfort, neck or arm discomfort. There has been no new shortness of breath, PND or orthopnea. There have been no reported palpitations, presyncope or syncope.   Past Medical History:  Diagnosis Date  . Raynaud's disease     Past Surgical History:  Procedure Laterality Date  . CARDIOVERSION N/A 01/11/2020   Procedure: CARDIOVERSION;  Surgeon: Fay Records, MD;  Location: [facility] ENDOSCOPY;  Service: Cardiovascular;  Laterality: N/A;  . COLONOSCOPY    . ROTATOR CUFF REPAIR Left 2004     Current Outpatient Medications  Medication Sig Dispense Refill  . metoprolol tartrate (LOPRESSOR) 50 MG tablet Take 2 hours prior to CT scan 1 tablet 0  . rivaroxaban (XARELTO) 20 MG TABS tablet Take 1 tablet (20 mg total) by mouth daily with supper. 90 tablet 3  . atorvastatin (LIPITOR) 20 MG tablet Take 1 tablet (20 mg total) by mouth daily. 90 tablet 3    No current facility-administered medications for this visit.    Allergies:   Patient has no known allergies.    ROS:  Please see the history of present illness.   Otherwise, review of systems are positive for none.   All other systems are reviewed and negative.    PHYSICAL EXAM: VS:  BP 120/70   Pulse 68   Ht 5\' 9"  (1.753 m)   Wt 190 lb (86.2 kg)   BMI 28.06 kg/m  , BMI Body mass index is 28.06 kg/m. GENERAL:  Well appearing NECK:  No jugular venous distention, waveform within normal limits, carotid upstroke brisk and symmetric, no bruits, no thyromegaly LUNGS:  Clear to auscultation bilaterally CHEST:  Unremarkable HEART:  PMI not displaced or sustained,S1 and S2 within normal limits, no S3, no S4, no clicks, no rubs, no murmurs ABD:  Flat, positive bowel sounds normal in frequency in pitch, no bruits, no rebound, no guarding, no midline pulsatile mass, no hepatomegaly, no splenomegaly EXT:  2 plus pulses throughout, no edema, no cyanosis no clubbing   EKG:  EKG is  not ordered today.    Recent Labs: 10/29/2019: ALT 32 12/14/2019: TSH 1.790 01/08/2020: BUN 21; Hemoglobin 14.4; Platelets 259; Potassium 4.9; Sodium 144 02/27/2020: Creatinine, Ser 0.90    Lipid  Panel    Component Value Date/Time   CHOL 224 (H) 10/29/2019 1232   TRIG 51 10/29/2019 1232   HDL 99 10/29/2019 1232   CHOLHDL 2.3 10/29/2019 1232   VLDL 9 12/04/2015 0911   LDLCALC 112 (H) 10/29/2019 1232   LDLDIRECT 101.2 09/03/2009 0000      Wt Readings from Last 3 Encounters:  03/11/20 190 lb (86.2 kg)  01/30/20 189 lb (85.7 kg)  01/11/20 188 lb (85.3 kg)      Other studies Reviewed: Additional studies/ records that were reviewed today include: Labs, echo, CT Review of the above records demonstrates:  Please see elsewhere in the note.     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  He has a slow ventricular rate and really does not notice this. [name] has a CHA2DS2 - VASc  score of 1.  Actually his risk is probably closer to 1.5 if you consider his age to be 73 and that he does have some evidence of coronary disease as described below.  I would suggest continuing the anticoagulation but we had a long discussion about this.  While we are pursuing consideration and rhythm management at least I would continue this.  It may be that his mitral regurgitation has led to his atrial fibrillation but on the chance that it is the other way around I would like him to be considered further for rhythm management.  I think it would be reasonable to consider early on atrial fibrillation ablation but I will defer to my electrophysiology colleagues and make this referral.  MR:   He had no moderate MR on echo and I will follow up with an echo in one year.  I will be following this closely as his ejection fraction is slightly low for someone with mitral regurgitation.  CAD:   We had a long discussion about his nonobstructive plaque.  I am going to manage him aggressively medically.  We talked about a plant-based diet.  He is  MESA score is 7.6.  We had a long discussion about statin and he agrees to take this and I will start him on Lipitor 20 with plans for lipid profile in about 10 weeks.    COVID EDUCATION: He has had both of his vaccines.  Current medicines are reviewed at length with the patient today.  The patient does not have concerns regarding medicines.  The following changes have been made:  As above  Labs/ tests ordered today include:   Orders Placed This Encounter  Procedures  . Lipid panel  . Ambulatory referral to Cardiac Electrophysiology     Disposition:   FU with me in 3 months.   Signed, Minus Breeding, MD  03/11/2020 3:42 PM    [facility]

## 2020-03-11 ENCOUNTER — Ambulatory Visit: Payer: PPO | Admitting: Cardiology

## 2020-03-11 ENCOUNTER — Encounter: Payer: Self-pay | Admitting: Cardiology

## 2020-03-11 ENCOUNTER — Other Ambulatory Visit: Payer: Self-pay

## 2020-03-11 VITALS — BP 120/70 | HR 68 | Ht 69.0 in | Wt 190.0 lb

## 2020-03-11 DIAGNOSIS — I340 Nonrheumatic mitral (valve) insufficiency: Secondary | ICD-10-CM

## 2020-03-11 DIAGNOSIS — R931 Abnormal findings on diagnostic imaging of heart and coronary circulation: Secondary | ICD-10-CM

## 2020-03-11 DIAGNOSIS — E785 Hyperlipidemia, unspecified: Secondary | ICD-10-CM

## 2020-03-11 DIAGNOSIS — I4891 Unspecified atrial fibrillation: Secondary | ICD-10-CM

## 2020-03-11 DIAGNOSIS — I2510 Atherosclerotic heart disease of native coronary artery without angina pectoris: Secondary | ICD-10-CM

## 2020-03-11 MED ORDER — ATORVASTATIN CALCIUM 20 MG PO TABS
20.0000 mg | ORAL_TABLET | Freq: Every day | ORAL | 3 refills | Status: DC
Start: 2020-03-11 — End: 2020-11-20

## 2020-03-11 NOTE — Patient Instructions (Signed)
Medication Instructions:  START atorvastatin 20mg  daily for cholesterol   *If you need a refill on your cardiac medications before your next appointment, please call your pharmacy*   Lab Work: FASTING lipid panel in 10 weeks  If you have labs (blood work) drawn today and your tests are completely normal, you will receive your results only by:  [place] (if you have MyChart) OR  A paper copy in the mail If you have any lab test that is abnormal or we need to change your treatment, we will call you to review the results.   Testing/Procedures: NONE   Follow-Up: At [facility], you and your health needs are our priority.  As part of our continuing mission to provide you with exceptional heart care, we have created designated Provider Care Teams.  These Care Teams include your primary Cardiologist (physician) and Advanced Practice Providers (APPs -  Physician Assistants and Nurse Practitioners) who all work together to provide you with the care you need, when you need it.  We recommend signing up for the patient portal called "MyChart".  Sign up information is provided on this After Visit Summary.  MyChart is used to connect with patients for Virtual Visits (Telemedicine).  Patients are able to view lab/test results, encounter notes, upcoming appointments, etc.  Non-urgent messages can be sent to your provider as well.   To learn more about what you can do with MyChart, go to [email or web address].    Your next appointment:   3 month(s)  The format for your next appointment:   In Person  Provider:   You may see Minus Breeding, MD or one of the following Advanced Practice Providers on your designated Care Team:    Rosaria Ferries, PA-C  Jory Sims, DNP, ANP  Cadence Kathlen Mody, NP    Other Instructions  You have been referred to Dr. Curt Bears or Dr. Rayann Heman - cardiac electrophysiologists [id number]"

## 2020-04-14 ENCOUNTER — Other Ambulatory Visit: Payer: Self-pay

## 2020-04-14 ENCOUNTER — Other Ambulatory Visit: Payer: PPO | Admitting: Internal Medicine

## 2020-04-14 ENCOUNTER — Telehealth: Payer: Self-pay | Admitting: Internal Medicine

## 2020-04-14 DIAGNOSIS — Z20822 Contact with and (suspected) exposure to covid-19: Secondary | ICD-10-CM | POA: Diagnosis not present

## 2020-04-14 DIAGNOSIS — R972 Elevated prostate specific antigen [PSA]: Secondary | ICD-10-CM | POA: Diagnosis not present

## 2020-04-14 DIAGNOSIS — E7800 Pure hypercholesterolemia, unspecified: Secondary | ICD-10-CM

## 2020-04-14 LAB — HEPATIC FUNCTION PANEL
AG Ratio: 1.7 (calc) (ref 1.0–2.5)
ALT: 23 U/L (ref 9–46)
AST: 28 U/L (ref 10–35)
Albumin: 4.2 g/dL (ref 3.6–5.1)
Alkaline phosphatase (APISO): 63 U/L (ref 35–144)
Bilirubin, Direct: 0.2 mg/dL (ref 0.0–0.2)
Globulin: 2.5 g/dL (calc) (ref 1.9–3.7)
Indirect Bilirubin: 0.5 mg/dL (calc) (ref 0.2–1.2)
Total Bilirubin: 0.7 mg/dL (ref 0.2–1.2)
Total Protein: 6.7 g/dL (ref 6.1–8.1)

## 2020-04-14 LAB — LIPID PANEL
Cholesterol: 167 mg/dL (ref <200)
HDL: 101 mg/dL (ref >=40)
LDL Cholesterol (Calc): 56 mg/dL (calc)
Non-HDL Cholesterol (Calc): 66 mg/dL (calc) (ref <130)
Total CHOL/HDL Ratio: 1.7 (calc) (ref <5.0)
Triglycerides: 36 mg/dL (ref <150)

## 2020-04-14 LAB — PSA: PSA: 4.9 ng/mL — ABNORMAL HIGH (ref <=4.0)

## 2020-04-14 NOTE — Telephone Encounter (Addendum)
Trigger called to say that when he was returning home from getting his lab work that his son-in-law called to say that he had tested positive for COVID. His son-in-law and the children had spent the weekend with patient and his wife. The son-in-law had been tired on Sunday and the Sunday night come down with a cough. Patient is wandering should he be tested, or what should he and his wife do.

## 2020-04-14 NOTE — Telephone Encounter (Signed)
After speaking with Dr Renold Genta, I called patient to let him know we would do car visit on Wednesday and test them because today would be too soon, with exposure yesterday, and they are having no symptoms. However patient stated they are on their way to urgent care to have rapid test now and will quaratine and watch for symptoms. Will call us back. I have canceled appointment for tomorrow.

## 2020-04-14 NOTE — Addendum Note (Signed)
Addended by: Mady Haagensen on: 04/14/2020 09:27 AM   Modules accepted: Orders

## 2020-04-15 ENCOUNTER — Ambulatory Visit: Payer: PPO | Admitting: Internal Medicine

## 2020-04-17 DIAGNOSIS — U071 COVID-19: Secondary | ICD-10-CM | POA: Diagnosis not present

## 2020-04-17 DIAGNOSIS — Z20822 Contact with and (suspected) exposure to covid-19: Secondary | ICD-10-CM | POA: Diagnosis not present

## 2020-04-18 NOTE — Telephone Encounter (Signed)
Patient called back to say he went to be tested yesterday because he is now having sinus symptoms and he is now positive for COVID. Will call back to reschedule after symptoms are gone.

## 2020-04-21 ENCOUNTER — Institutional Professional Consult (permissible substitution): Payer: PPO | Admitting: Internal Medicine

## 2020-05-19 ENCOUNTER — Other Ambulatory Visit: Payer: Self-pay

## 2020-05-19 ENCOUNTER — Ambulatory Visit: Payer: PPO | Admitting: Internal Medicine

## 2020-05-19 ENCOUNTER — Encounter: Payer: Self-pay | Admitting: Internal Medicine

## 2020-05-19 VITALS — BP 114/82 | HR 52 | Ht 69.0 in | Wt 185.0 lb

## 2020-05-19 DIAGNOSIS — I340 Nonrheumatic mitral (valve) insufficiency: Secondary | ICD-10-CM | POA: Diagnosis not present

## 2020-05-19 DIAGNOSIS — I4891 Unspecified atrial fibrillation: Secondary | ICD-10-CM

## 2020-05-19 NOTE — Progress Notes (Signed)
Electrophysiology Office Note   Date:  05/19/2020   ID:  Grant Griffin, DOB 09-Dec-1950, MRN [id number]  PCP:  Grant Showers, MD  Cardiologist:  Grant Griffin Primary Electrophysiologist: Thompson Grayer, MD    CC: afib   History of Present Illness: Grant Griffin is a 69 y.o. male who presents today for electrophysiology evaluation.   He is referred by Grant Griffin for EP consultation regarding afib.  He was initially diagnosed with afib in February after presenting for routine colonoscopy evaluation.  He was subsequently evaluated by Grant Griffin and underwent cardioversion.  He returned to afib quickly thereafter and does not recall feeling better post cardioversion. He is active.  Reports mild SOB with moderate activity.  Today, he denies symptoms of palpitations, chest pain, orthopnea, PND, lower extremity edema, claudication, dizziness, presyncope, syncope, bleeding, or neurologic sequela. The patient is tolerating medications without difficulties and is otherwise without complaint today.    Past Medical History:  Diagnosis Date  . Biatrial enlargement   . Moderate mitral regurgitation   . Persistent atrial fibrillation ([place])   . Raynaud's disease    Past Surgical History:  Procedure Laterality Date  . CARDIOVERSION N/A 01/11/2020   Procedure: CARDIOVERSION;  Surgeon: Fay Records, MD;  Location: [facility] ENDOSCOPY;  Service: Cardiovascular;  Laterality: N/A;  . COLONOSCOPY    . ROTATOR CUFF REPAIR Left 2004     Current Outpatient Medications  Medication Sig Dispense Refill  . atorvastatin (LIPITOR) 20 MG tablet Take 1 tablet (20 mg total) by mouth daily. 90 tablet 3  . rivaroxaban (XARELTO) 20 MG TABS tablet Take 1 tablet (20 mg total) by mouth daily with supper. 90 tablet 3   No current facility-administered medications for this visit.    Allergies:   Patient has no known allergies.   Social History:  The patient  reports that he has never smoked. He has never used smokeless  tobacco. He reports current alcohol use. He reports that he does not use drugs.   Family History:  The patient's  family history includes Heart disease (age of onset: 11) in his father.    ROS:  Please see the history of present illness.   All other systems are personally reviewed and negative.    PHYSICAL EXAM: VS:  BP 114/82   Pulse (!) 52   Ht 5\' 9"  (1.753 m)   Wt 185 lb (83.9 kg)   SpO2 95%   BMI 27.32 kg/m  , BMI Body mass index is 27.32 kg/m. GEN: Well nourished, well developed, in no acute distress HEENT: normal Neck: no JVD, carotid bruits, or masses Cardiac: iRRR; no murmurs, rubs, or gallops,no edema  Respiratory:  clear to auscultation bilaterally, normal work of breathing GI: soft, nontender, nondistended, + BS MS: no deformity or atrophy Skin: warm and dry  Neuro:  Strength and sensation are intact Psych: euthymic mood, full affect  EKG:  EKG is ordered today. The ekg ordered today is personally reviewed and shows afib with slow ventricular response (52 bpm)   Recent Labs: 12/14/2019: TSH 1.790 01/08/2020: BUN 21; Hemoglobin 14.4; Platelets 259; Potassium 4.9; Sodium 144 02/27/2020: Creatinine, Ser 0.90 04/14/2020: ALT 23  personally reviewed   Lipid Panel     Component Value Date/Time   CHOL 167 04/14/2020 0934   TRIG 36 04/14/2020 0934   HDL 101 04/14/2020 0934   CHOLHDL 1.7 04/14/2020 0934   VLDL 9 12/04/2015 0911   LDLCALC 56 04/14/2020 0934   LDLDIRECT  101.2 09/03/2009 0000   personally reviewed   Wt Readings from Last 3 Encounters:  05/19/20 185 lb (83.9 kg)  03/11/20 190 lb (86.2 kg)  01/30/20 189 lb (85.7 kg)    Echo 12/25/19- EF 55-60%, moderate biatrial enlargement, moderate MR   Other studies personally reviewed: Additional studies/ records that were reviewed today include: Grant Cherlyn Cushing notes, prior ekg/echos  Review of the above records today demonstrates: as above   ASSESSMENT AND PLAN:  1.  Persistent atrial fibrillation The  patient has minimally symptomatic, recurrent persistent atrial fibrillation. Chads2vasc score is 1.  he is anticoagulated with xarelto . Therapeutic strategies for afib including medicine (flecainide, tikosyn, amiodarone) and ablation were discussed in detail with the patient today. Risk, benefits, and alternatives to each approach were discussed at length. We discussed AFFIRM trial as well as EAST-AF NET 4 trial results and their implications. He would prefer a rate control strategy going forward.  I have therefore made no changes today.  2. Moderate MR Consider TEE to further assess in the future.  Risks, benefits and potential toxicities for medications prescribed and/or refilled reviewed with patient today.    Follow-up:  Return in 4 months Follow-up with Grant Griffin as scheduled in October  Current medicines are reviewed at length with the patient today.   The patient does not have concerns regarding his medicines.  The following changes were made today:  none  Labs/ tests ordered today include:  Orders Placed This Encounter  Procedures  . EKG 12-Lead     Signed, Thompson Grayer, MD  05/19/2020 11:14 AM     [facility] [address] [place] [address] [phone] (office) [phone] (fax)

## 2020-05-19 NOTE — Patient Instructions (Addendum)
Medication Instructions:  Your physician recommends that you continue on your current medications as directed. Please refer to the Current Medication list given to you today.  *If you need a refill on your cardiac medications before your next appointment, please call your pharmacy*  Lab Work: None ordered.  If you have labs (blood work) drawn today and your tests are completely normal, you will receive your results only by: Marland Kitchen MyChart Message (if you have MyChart) OR . A paper copy in the mail If you have any lab test that is abnormal or we need to change your treatment, we will call you to review the results.  Testing/Procedures: None ordered.  Follow-Up: At [facility], you and your health needs are our priority.  As part of our continuing mission to provide you with exceptional heart care, we have created designated Provider Care Teams.  These Care Teams include your primary Cardiologist (physician) and Advanced Practice Providers (APPs -  Physician Assistants and Nurse Practitioners) who all work together to provide you with the care you need, when you need it.  We recommend signing up for the patient portal called "MyChart".  Sign up information is provided on this After Visit Summary.  MyChart is used to connect with patients for Virtual Visits (Telemedicine).  Patients are able to view lab/test results, encounter notes, upcoming appointments, etc.  Non-urgent messages can be sent to your provider as well.   To learn more about what you can do with MyChart, go to [email or web address].    Your next appointment:   Your physician wants you to follow-up in: 09/23/19 at 9:45 am with Dr. Rayann Heman.     Other Instructions:

## 2020-05-20 ENCOUNTER — Encounter: Payer: Self-pay | Admitting: Internal Medicine

## 2020-05-20 ENCOUNTER — Ambulatory Visit (INDEPENDENT_AMBULATORY_CARE_PROVIDER_SITE_OTHER): Payer: PPO | Admitting: Internal Medicine

## 2020-05-20 VITALS — BP 102/70 | HR 60 | Ht 69.0 in | Wt 183.0 lb

## 2020-05-20 DIAGNOSIS — E7800 Pure hypercholesterolemia, unspecified: Secondary | ICD-10-CM

## 2020-05-20 DIAGNOSIS — Z7184 Encounter for health counseling related to travel: Secondary | ICD-10-CM

## 2020-05-20 DIAGNOSIS — I482 Chronic atrial fibrillation, unspecified: Secondary | ICD-10-CM

## 2020-05-20 DIAGNOSIS — R972 Elevated prostate specific antigen [PSA]: Secondary | ICD-10-CM | POA: Diagnosis not present

## 2020-05-20 DIAGNOSIS — R931 Abnormal findings on diagnostic imaging of heart and coronary circulation: Secondary | ICD-10-CM

## 2020-05-20 DIAGNOSIS — H18513 Endothelial corneal dystrophy, bilateral: Secondary | ICD-10-CM | POA: Diagnosis not present

## 2020-05-20 DIAGNOSIS — Z961 Presence of intraocular lens: Secondary | ICD-10-CM | POA: Diagnosis not present

## 2020-05-20 DIAGNOSIS — I340 Nonrheumatic mitral (valve) insufficiency: Secondary | ICD-10-CM

## 2020-05-20 DIAGNOSIS — Z7901 Long term (current) use of anticoagulants: Secondary | ICD-10-CM

## 2020-05-20 MED ORDER — ZOLPIDEM TARTRATE 5 MG PO TABS: 5.0000 mg | ORAL_TABLET | Freq: Every day | ORAL | 0 refills | Status: DC

## 2020-05-21 DIAGNOSIS — E785 Hyperlipidemia, unspecified: Secondary | ICD-10-CM | POA: Diagnosis not present

## 2020-05-21 DIAGNOSIS — I2510 Atherosclerotic heart disease of native coronary artery without angina pectoris: Secondary | ICD-10-CM | POA: Diagnosis not present

## 2020-05-21 LAB — LIPID PANEL
Chol/HDL Ratio: 1.5 ratio (ref 0.0–5.0)
Cholesterol, Total: 159 mg/dL (ref 100–199)
HDL: 104 mg/dL (ref >39)
LDL Chol Calc (NIH): 46 mg/dL (ref 0–99)
Triglycerides: 36 mg/dL (ref 0–149)
VLDL Cholesterol Cal: 9 mg/dL (ref 5–40)

## 2020-05-24 DIAGNOSIS — Z20822 Contact with and (suspected) exposure to covid-19: Secondary | ICD-10-CM | POA: Diagnosis not present

## 2020-06-02 NOTE — Progress Notes (Signed)
   Subjective:    Patient ID: Grant Griffin, [name], male    DOB: [birth year], 69 y.o.   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  Patient subsequently tested positive for COVID-19 but did not become very ill.  He quarantined at [place] home at [facility] for several days.  His wife who stayed here in [place], never became ill.  Patient has had 2 COVID-19 immunizations in January and February of this year.  He and his wife are planning a trip to [place] to attend a wedding and also to do some sightseeing.  He needs a letter saying he has recovered from COVID-19.  This will be provided.  He is on Lipitor 20 mg daily and his lipid panel is excellent.  His liver functions are normal.  He is on Xarelto 20 mg daily with supper from Dr. Percival Spanish.  Requesting Ambien for trip to [place].  Given #12 Ambien 5mg  tablets.   History of moderate mitral regurgitation followed by Dr. Percival Spanish.  He had an elevated coronary calcium score of 110.  Also noted to have 25 to 49% proximal LAD stenosis.  He had a cardioversion May 2021 and was in sinus rhythm but subsequently went back into atrial fibrillation afterwards by late May.  He remains in atrial fib and is on Xarelto.  He is very physically active and does not seem to have significant symptoms being in A. fib.  Review of Systems he feels well.  No new complaints. He has an elevated PSA of 4.9 followed by urology  He had colonoscopy in February was 7-year follow-up recommended    Objective:   Physical Exam  Blood pressure 102/70 pulse 60 irregular, pulse oximetry 97% weight 183 pounds BMI 27.02 skin warm and dry.  No cervical adenopathy.  No carotid bruits.  Chest clear to auscultation.  Cardiac exam: Irregular irregular rhythm consistent with atrial fibrillation.  No lower extremity edema.      Assessment & Plan:  History of  mild COVID-19 infection-stable.  He has had 2 COVID-19 immunizations and should plan to get booster in the future.  Letter of recovery written for him to take on a trip to [place]  BPH-followed by urology and PSA this year is 4.9  History of mitral regurgitation followed by cardiology  History of elevated coronary calcium score followed by cardiology treated with Lipitor 20 mg daily  Atrial fibrillation treated with Xarelto daily  Travel to [place]: Have given him Ambien 5 mg 1 p.o. nightly as needed for insomnia during travel or on airplane #12 tabs  Plan: Return in 6 months for Medicare wellness and health maintenance exam.

## 2020-06-02 NOTE — Patient Instructions (Signed)
It was a pleasure to see you today.  We are glad you have recovered from mild COVID-19 infection.  Continue close follow-up with Cardiology.  Continue Lipitor and Xarelto.  Take Ambien 5 mg on trip to [place] as needed for insomnia.  Have Covid booster in a few months.  Have annual flu vaccine.  Return in 6 months for Medicare wellness exam and health maintenance exam.

## 2020-06-17 DIAGNOSIS — M7701 Medial epicondylitis, right elbow: Secondary | ICD-10-CM | POA: Diagnosis not present

## 2020-06-22 NOTE — Progress Notes (Signed)
Cardiology Office Note  Already Date:  06/23/2020   ID:  [name], [name], DOB [birth year], MRN [id number]  PCP:  Elby Showers, MD  Cardiologist:   Minus Breeding, MD Referring:  Elby Showers, MD  Chief Complaint  Patient presents with  . 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  At the last visit I set him up for a DCCV.  I sent him for an echo which showed moderate MR.   He had an elevated coronary calcium as well on CT with a score of 110 which was 45% for his age and gender.  This demonstrated 25 to 49% proximal LAD stenosis.  He had cardioversion on 01/11/2020 and was in sinus rhythm following this.  However, he was back in atrial fibrillation on follow-up in late May.    Since I last saw him he saw Dr. Rayann Heman and the decision was to continue with rate control and anticoagulation.  Since I last saw him he has done well.  He did not really feel his fibrillation.  He exercises routinely. The patient denies any new symptoms such as chest discomfort, neck or arm discomfort. There has been no new shortness of breath, PND or orthopnea. There have been no reported palpitations, presyncope or syncope.    Past Medical History:  Diagnosis Date  . Biatrial enlargement   . Moderate mitral regurgitation   . Persistent atrial fibrillation ([place])   . Raynaud's disease     Past Surgical History:  Procedure Laterality Date  . CARDIOVERSION N/A 01/11/2020   Procedure: CARDIOVERSION;  Surgeon: Fay Records, MD;  Location: [facility] ENDOSCOPY;  Service: Cardiovascular;  Laterality: N/A;  . COLONOSCOPY    . ROTATOR CUFF REPAIR Left 2004     Current Outpatient Medications  Medication Sig Dispense Refill  . atorvastatin (LIPITOR) 20 MG tablet Take 1 tablet (20 mg total) by mouth daily. 90 tablet 3  . rivaroxaban (XARELTO) 20 MG TABS tablet Take 1 tablet (20 mg  total) by mouth daily with supper. 90 tablet 3  . zolpidem (AMBIEN) 5 MG tablet Take 1 tablet (5 mg total) by mouth at bedtime. 12 tablet 0   No current facility-administered medications for this visit.    Allergies:   Patient has no known allergies.    ROS:  Please see the history of present illness.   Otherwise, review of systems are positive for none yet he has been.   All other systems are reviewed and negative.    PHYSICAL EXAM: VS:  BP 124/82   Pulse (!) 58   Ht 5\' 10"  (1.778 m)   Wt 187 lb 4.8 oz (85 kg)   BMI 26.87 kg/m  , BMI Body mass index is 26.87 kg/m. GENERAL:  Well appearing NECK:  No jugular venous distention, waveform within normal limits, carotid upstroke brisk and symmetric, no bruits, no thyromegaly LUNGS:  Clear to auscultation bilaterally CHEST:  Unremarkable HEART:  PMI not displaced or sustained,S1 and S2 within normal limits, no S3, no clicks, no rubs, slight apical systolic murmur radiating slightly to the axilla heard only in the left lateral position, no diastolic murmurs, irregular ABD:  Flat, positive bowel sounds normal in frequency in pitch, no bruits, no rebound, no guarding, no midline pulsatile mass, no hepatomegaly, no splenomegaly EXT:  2  plus pulses throughout, no edema, no cyanosis no clubbing    EKG:  EKG is not ordered today. NA   Recent Labs: 12/14/2019: TSH 1.790 01/08/2020: BUN 21; Hemoglobin 14.4; Platelets 259; Potassium 4.9; Sodium 144 02/27/2020: Creatinine, Ser 0.90 04/14/2020: ALT 23    Lipid Panel    Component Value Date/Time   CHOL 159 05/21/2020 0825   TRIG 36 05/21/2020 0825   HDL 104 05/21/2020 0825   CHOLHDL 1.5 05/21/2020 0825   CHOLHDL 1.7 04/14/2020 0934   VLDL 9 12/04/2015 0911   LDLCALC 46 05/21/2020 0825   LDLCALC 56 04/14/2020 0934   LDLDIRECT 101.2 09/03/2009 0000      Wt Readings from Last 3 Encounters:  06/23/20 187 lb 4.8 oz (85 kg)  05/20/20 183 lb (83 kg)  05/19/20 185 lb (83.9 kg)      Other  studies Reviewed: Additional studies/ records that were reviewed today include: None Review of the above records demonstrates:  Please see elsewhere in the note.     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  He has a slow ventricular rate and really does not notice this. 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  Actually his risk is probably closer to 1.5.  We had a long conversation about this today and he will continue with his anticoagulation.  MR:   This is moderate on transthoracic echo.  My plan will be to follow this up with a transesophageal echo which I will schedule for the spring.  We had a long conversation about this as well. .  CAD:   He has nonobstructive coronary disease. He is  MESA score is 7.6.  We are pursuing aggressive risk reduction.  DYSLIPIDEMIA: He had an excellent response to low-dose Lipitor.  He will continue on meds as listed.    Current medicines are reviewed at length with the patient today.  The patient does not have concerns regarding medicines.  The following changes have been made:    Labs/ tests ordered today include:  TEE  No orders of the defined types were placed in this encounter.    Disposition:   FU with me after the TEE.    Signed, Minus Breeding, MD  06/23/2020 11:18 AM    [place]

## 2020-06-23 ENCOUNTER — Other Ambulatory Visit: Payer: Self-pay

## 2020-06-23 ENCOUNTER — Encounter: Payer: Self-pay | Admitting: Cardiology

## 2020-06-23 ENCOUNTER — Ambulatory Visit: Payer: PPO | Admitting: Cardiology

## 2020-06-23 VITALS — BP 124/82 | HR 58 | Ht 70.0 in | Wt 187.3 lb

## 2020-06-23 DIAGNOSIS — I340 Nonrheumatic mitral (valve) insufficiency: Secondary | ICD-10-CM

## 2020-06-23 DIAGNOSIS — I2510 Atherosclerotic heart disease of native coronary artery without angina pectoris: Secondary | ICD-10-CM

## 2020-06-23 DIAGNOSIS — I4819 Other persistent atrial fibrillation: Secondary | ICD-10-CM | POA: Diagnosis not present

## 2020-06-23 NOTE — Patient Instructions (Signed)
Medication Instructions:  Your physician recommends that you continue on your current medications as directed. Please refer to the Current Medication list given to you today.  *If you need a refill on your cardiac medications before your next appointment, please call your pharmacy*  Testing/Procedures: Your physician has requested that you have a TEE in April 2022. During a TEE, sound waves are used to create images of your heart. It provides your doctor with information about the size and shape of your heart and how well your heart's chambers and valves are working. In this test, a transducer is attached to the end of a flexible tube that's guided down your throat and into your esophagus (the tube leading from you mouth to your stomach) to get a more detailed image of your heart. You are not awake for the procedure. Please see the instruction sheet given to you today. For further information please visit [email or web address]. (We will call to schedule this closer to date)  Follow-Up: At [facility], you and your health needs are our priority.  As part of our continuing mission to provide you with exceptional heart care, we have created designated Provider Care Teams.  These Care Teams include your primary Cardiologist (physician) and Advanced Practice Providers (APPs -  Physician Assistants and Nurse Practitioners) who all work together to provide you with the care you need, when you need it.  We recommend signing up for the patient portal called "MyChart".  Sign up information is provided on this After Visit Summary.  MyChart is used to connect with patients for Virtual Visits (Telemedicine).  Patients are able to view lab/test results, encounter notes, upcoming appointments, etc.  Non-urgent messages can be sent to your provider as well.   To learn more about what you can do with MyChart, go to [email or web address].    Your next appointment:   6 month(s)  The format for your next  appointment:   In Person  Provider:   Minus Breeding, MD   Other Instructions  You are scheduled for a TEE  on ____ with Dr. _____.  Please arrive at the [facility] (Main Entrance A) at [facility]: [address] [place], [place] [address] at _____ am/pm. (1 hour prior to procedure unless lab work is needed; if lab work is needed arrive 1.5 hours ahead)  DIET: Nothing to eat or drink after midnight except a sip of water with medications (see medication instructions below)  Medication Instructions: Hold _____  Continue your anticoagulant: ____ Dennis Bast will need to continue your anticoagulant after your procedure until you  are told by your  Provider that it is safe to stop   Labs: Within one week of TEE Come to: Come to the lab at [place], open M-F 8-4, closed for lunch, no appointment needed.    You must have a responsible person to drive you home and stay in the waiting area during your procedure. Failure to do so could result in cancellation.  Bring your insurance cards.  *Special Note: Every effort is made to have your procedure done on time. Occasionally there are emergencies that occur at the hospital that may cause delays. Please be patient if a delay does occur.

## 2020-07-01 ENCOUNTER — Encounter: Payer: Self-pay | Admitting: Internal Medicine

## 2020-08-07 ENCOUNTER — Encounter: Payer: Self-pay | Admitting: Internal Medicine

## 2020-08-07 DIAGNOSIS — Z85828 Personal history of other malignant neoplasm of skin: Secondary | ICD-10-CM | POA: Diagnosis not present

## 2020-08-07 DIAGNOSIS — L814 Other melanin hyperpigmentation: Secondary | ICD-10-CM | POA: Diagnosis not present

## 2020-08-07 DIAGNOSIS — D485 Neoplasm of uncertain behavior of skin: Secondary | ICD-10-CM | POA: Diagnosis not present

## 2020-08-07 DIAGNOSIS — L570 Actinic keratosis: Secondary | ICD-10-CM | POA: Diagnosis not present

## 2020-08-07 DIAGNOSIS — D225 Melanocytic nevi of trunk: Secondary | ICD-10-CM | POA: Diagnosis not present

## 2020-09-09 ENCOUNTER — Other Ambulatory Visit: Payer: Self-pay | Admitting: Cardiology

## 2020-09-09 NOTE — Telephone Encounter (Signed)
Prescription refill request for Xarelto received.  Indication:atrial fibrillation Last office visit:10/21  hochrein Weight:85 kg Age:70 Scr:0.97  5/21 CrCl: 83.98 ml/min  Prescription refilled

## 2020-09-22 ENCOUNTER — Ambulatory Visit: Payer: PPO | Admitting: Internal Medicine

## 2020-09-30 DIAGNOSIS — M7701 Medial epicondylitis, right elbow: Secondary | ICD-10-CM | POA: Diagnosis not present

## 2020-10-31 ENCOUNTER — Other Ambulatory Visit: Payer: PPO | Admitting: Internal Medicine

## 2020-11-04 ENCOUNTER — Encounter: Payer: PPO | Admitting: Internal Medicine

## 2020-11-20 ENCOUNTER — Other Ambulatory Visit: Payer: Self-pay | Admitting: Cardiology

## 2020-12-05 ENCOUNTER — Other Ambulatory Visit: Payer: Self-pay

## 2020-12-05 ENCOUNTER — Other Ambulatory Visit: Payer: PPO | Admitting: Internal Medicine

## 2020-12-05 DIAGNOSIS — I7300 Raynaud's syndrome without gangrene: Secondary | ICD-10-CM | POA: Diagnosis not present

## 2020-12-05 DIAGNOSIS — R972 Elevated prostate specific antigen [PSA]: Secondary | ICD-10-CM

## 2020-12-05 DIAGNOSIS — E7800 Pure hypercholesterolemia, unspecified: Secondary | ICD-10-CM | POA: Diagnosis not present

## 2020-12-05 DIAGNOSIS — R7302 Impaired glucose tolerance (oral): Secondary | ICD-10-CM | POA: Diagnosis not present

## 2020-12-05 DIAGNOSIS — Z0000 Encounter for general adult medical examination without abnormal findings: Secondary | ICD-10-CM

## 2020-12-05 DIAGNOSIS — Z7901 Long term (current) use of anticoagulants: Secondary | ICD-10-CM | POA: Diagnosis not present

## 2020-12-05 DIAGNOSIS — I482 Chronic atrial fibrillation, unspecified: Secondary | ICD-10-CM

## 2020-12-09 ENCOUNTER — Ambulatory Visit (INDEPENDENT_AMBULATORY_CARE_PROVIDER_SITE_OTHER): Payer: PPO | Admitting: Internal Medicine

## 2020-12-09 ENCOUNTER — Other Ambulatory Visit: Payer: Self-pay

## 2020-12-09 ENCOUNTER — Encounter: Payer: Self-pay | Admitting: Internal Medicine

## 2020-12-09 VITALS — BP 110/80 | HR 74 | Ht 67.0 in | Wt 185.0 lb

## 2020-12-09 DIAGNOSIS — E7800 Pure hypercholesterolemia, unspecified: Secondary | ICD-10-CM

## 2020-12-09 DIAGNOSIS — Z86010 Personal history of colonic polyps: Secondary | ICD-10-CM

## 2020-12-09 DIAGNOSIS — R931 Abnormal findings on diagnostic imaging of heart and coronary circulation: Secondary | ICD-10-CM | POA: Diagnosis not present

## 2020-12-09 DIAGNOSIS — R7302 Impaired glucose tolerance (oral): Secondary | ICD-10-CM

## 2020-12-09 DIAGNOSIS — I340 Nonrheumatic mitral (valve) insufficiency: Secondary | ICD-10-CM

## 2020-12-09 DIAGNOSIS — Z0000 Encounter for general adult medical examination without abnormal findings: Secondary | ICD-10-CM | POA: Diagnosis not present

## 2020-12-09 DIAGNOSIS — I482 Chronic atrial fibrillation, unspecified: Secondary | ICD-10-CM

## 2020-12-09 DIAGNOSIS — R972 Elevated prostate specific antigen [PSA]: Secondary | ICD-10-CM | POA: Diagnosis not present

## 2020-12-09 DIAGNOSIS — Z7901 Long term (current) use of anticoagulants: Secondary | ICD-10-CM

## 2020-12-09 DIAGNOSIS — Z85828 Personal history of other malignant neoplasm of skin: Secondary | ICD-10-CM

## 2020-12-09 LAB — POCT URINALYSIS DIPSTICK
Appearance: NEGATIVE
Bilirubin, UA: NEGATIVE
Blood, UA: NEGATIVE
Glucose, UA: NEGATIVE
Ketones, UA: NEGATIVE
Leukocytes, UA: NEGATIVE
Nitrite, UA: NEGATIVE
Odor: NEGATIVE
Protein, UA: NEGATIVE
Spec Grav, UA: 1.015 (ref 1.010–1.025)
Urobilinogen, UA: 0.2 E.U./dL
pH, UA: 6.5 (ref 5.0–8.0)

## 2020-12-09 NOTE — Progress Notes (Signed)
Subjective:    Patient ID: Grant Griffin, [name], male    DOB: 08-02-1951, 70 y.o.   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  He feels well and has no complaints.  He watches his diet and plays golf.  His general health is excellent.  He has a history of impaired glucose tolerance.  No known drug allergies.  Past medical history: Left rotator cuff surgery 2004.  No history of serious illnesses requiring hospitalizations.  History of elevated PSA and has seen Dr. Diona Fanti in the past.  He will be referred back there.  History of multiple basal cell carcinomas of skin seen and treated by [facility].  Hx of mitral regurgitation followed by Cardiology.  History of elevated coronary calcium score followed by cardiology treated with Lipitor 20 mg daily.  Calcium score was 110 which was the 45th percentile for age and sex matched control.  Subsequently had coronary CT angiogram showing no plaque in right coronary artery.  Left main was a large artery that gave rise to LAD and left coronary arteries.  Left main had no plaque.  Calcified plaque in proximal LAD with stenosis of 25 to 49%.  Mid LAD had minimal calcified plaque with stenosis of 0 to 25%.  Circumflex was normal.  No plaque in right coronary artery.  He has no chest pain or shortness of breath.  Prior history of sciatica, allergic rhinitis and Raynaud's phenomenon.  Colonoscopy done in 2021 with 7-year follow-up recommended by Dr. Loletha Carrow.  1 polyp was adenomatous.    In 2011 he saw Dr. Wynonia Lawman for abnormal EKG with left axis deviation and T wave inversions dating back several years.  He was diagnosed with a left fascicular hemiblock.  Had negative Cardiolite study in 2004.  In 2011 he had a negative nuclear medicine test.  He had cardioversion for atrial fib in May 2021 and converted to sinus rhythm but by late May he  was back into atrial fibrillation.  He saw Dr. Rayann Heman.  Decision was made to continue with rate control and anticoagulation.  He does not feel his fibrillation and exercises regularly.  History of biatrial enlargement.  Social history: He is married.  He has a son who is a Pharmacist, community and practices here locally in [place].  Patient has a daughter who is a physical therapist and lives in [place].  He has grandchildren.  He has never smoked.  Enjoys [employer] history.  Social alcohol consumption.  Family history: Father with history of ischemic heart disease.  Father had MI at age 72.  Mother died in her late 40s of a stroke.  1 brother in good health.  1 sister in good health.  1 sister overweight but generally healthy.  PSA in 2021 was 4.6 and in 2019 was 3.8.  Currently it is 4.6 and he will be referred back to urology.    Review of Systems     Objective:   Physical Exam Blood pressure 110/80 pulse 74 pulse oximetry 96% weight 185 pounds BMI 28.98 height 5 feet 7 inches  Skin: Warm and dry no obvious abnormalities.  Nodes: None.  TMs are clear.  Neck is supple.  No carotid bruits.  Chest is clear to auscultation.  Cardiac exam: Irregular rhythm consistent with atrial fibrillation.  1/6 systolic ejection murmur.  Abdomen soft nondistended without hepatosplenomegaly masses or tenderness.  No lower  extremity pitting edema.  Prostate is normal.  Affect thought and judgment are normal.       Assessment & Plan:  History of coronary disease followed by Cardiologist  History of mitral regurgitation (moderate) and asymptomatic  History of elevated PSA.  PSA is once again elevated and he will will be referred back to urology.  Currently PSA is 6.21 and had been 4.99 months previously.  Atrial fibrillation followed by Dr. Percival Spanish on chronic anticoagulation.  Has upcoming appointment in the near future.  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  Pneumococcal vaccines up-to-date.  Tetanus immunization update given in February 2021.  Gets annual flu vaccine.  Return in 1 year or as needed.  Continue close follow-up with cardiology.  Subjective:   Patient presents for Medicare Annual/Subsequent preventive examination.  Review Past Medical/Family/Social: See above   Risk Factors  Current exercise habits: Exercises a good deal Dietary issues discussed: Low-fat low carbohydrate  Cardiac risk factors: Hyperlipidemia, MI in father, stroke in mother  Depression Screen  (Note: if answer to either of the following is "Yes", a more complete depression screening is indicated)   Over the past two weeks, have you felt down, depressed or hopeless? No  Over the past two weeks, have you felt little interest or pleasure in doing things?  Not often Have you lost interest or pleasure in daily life? No Do you often feel hopeless? No Do you cry easily over simple problems? No   Activities of Daily Living  In your present state of health, do you have any difficulty performing the following activities?:   Driving? No  Managing money? No  Feeding yourself? No  Getting from bed to chair? No  Climbing a flight of stairs? No  Preparing food and eating?: No  Bathing or showering? No  Getting dressed: No  Getting to the toilet? No  Using the toilet:No  Moving around from place to place: No  In the past year have you fallen or had a near fall?:No  Are you sexually active?  Yes Do you have more than one partner? No   Hearing Difficulties: No  Do you often ask people to speak up or repeat themselves? No  Do you experience ringing or noises in your ears? No  Do you have difficulty understanding soft or whispered  voices? No  Do you feel that you have a problem with memory? No Do you often misplace items? No    Home Safety:  Do you have a smoke alarm at your residence? Yes Do you have grab bars in the bathroom?  None Do you have throw rugs in your house?  No   Cognitive Testing  Alert? Yes Normal Appearance?Yes  Oriented to person? Yes Place? Yes  Time? Yes  Recall of three objects? Yes  Can perform simple calculations? Yes  Displays appropriate judgment?Yes  Can read the correct time from a watch face?Yes   List the Names of Other Physician/Practitioners you currently use:  See referral list for the physicians patient is currently seeing.  Dr. Percival Spanish  Dr. Diona Fanti   Review of Systems: See above   Objective:     General appearance: Appears younger than stated age and active Head: Normocephalic, without obvious abnormality,  atraumatic  Eyes: conj clear, EOMi PEERLA  Ears: normal TM's and external ear canals both ears  Nose: Nares normal. Septum midline. Mucosa normal. No drainage or sinus tenderness.  Throat: lips, mucosa, and tongue normal; teeth and gums normal  Neck: no adenopathy, no carotid bruit, no JVD, supple, symmetrical, trachea midline and thyroid not enlarged, symmetric, no tenderness/mass/nodules  No CVA tenderness.  Lungs: clear to auscultation bilaterally  Breasts: normal appearance, no masses or tenderness Heart: Irregular  rhythm, S1, S2 normal, 1/6 systolic murmur Abdomen: soft, non-tender; bowel sounds normal; no masses, no organomegaly  Musculoskeletal: ROM normal in all joints, no crepitus, no deformity, Normal muscle strengthen. Back  is symmetric, no curvature. Skin: Skin color, texture, turgor normal. No rashes or lesions  Lymph nodes: Cervical, supraclavicular, and axillary nodes normal.  Neurologic: CN 2 -12 Normal, Normal symmetric reflexes. Normal coordination and gait  Psych: Alert & Oriented x 3, Mood appear stable.    Assessment:    Annual  wellness medicare exam   Plan:    During the course of the visit the patient was educated and counseled about appropriate screening and preventive services including:   See above     Patient Instructions (the written plan) was given to the patient.  Medicare Attestation  I have personally reviewed:  The patient's medical and social history  Their use of alcohol, tobacco or illicit drugs  Their current medications and supplements  The patient's functional ability including ADLs,fall risks, home safety risks, cognitive, and hearing and visual impairment  Diet and physical activities  Evidence for depression or mood disorders  The patient's weight, height, BMI, and visual acuity have been recorded in the chart. I have made referrals, counseling, and provided education to the patient based on review of the above and I have provided the patient with a written personalized care plan for preventive services.

## 2020-12-10 DIAGNOSIS — I10 Essential (primary) hypertension: Secondary | ICD-10-CM

## 2020-12-10 DIAGNOSIS — I340 Nonrheumatic mitral (valve) insufficiency: Secondary | ICD-10-CM

## 2020-12-11 LAB — TEST AUTHORIZATION

## 2020-12-11 LAB — CBC WITH DIFFERENTIAL/PLATELET
Absolute Monocytes: 355 cells/uL (ref 200–950)
Basophils Absolute: 20 cells/uL (ref 0–200)
Basophils Relative: 0.5 %
Eosinophils Absolute: 117 cells/uL (ref 15–500)
Eosinophils Relative: 3 %
HCT: 40.1 % (ref 38.5–50.0)
Hemoglobin: 14.1 g/dL (ref 13.2–17.1)
Lymphs Abs: 1229 cells/uL (ref 850–3900)
MCH: 31.4 pg (ref 27.0–33.0)
MCHC: 35.2 g/dL (ref 32.0–36.0)
MCV: 89.3 fL (ref 80.0–100.0)
MPV: 10.3 fL (ref 7.5–12.5)
Monocytes Relative: 9.1 %
Neutro Abs: 2180 cells/uL (ref 1500–7800)
Neutrophils Relative %: 55.9 %
Platelets: 246 10*3/uL (ref 140–400)
RBC: 4.49 10*6/uL (ref 4.20–5.80)
RDW: 12.8 % (ref 11.0–15.0)
Total Lymphocyte: 31.5 %
WBC: 3.9 10*3/uL (ref 3.8–10.8)

## 2020-12-11 LAB — COMPLETE METABOLIC PANEL WITH GFR
AG Ratio: 1.8 (calc) (ref 1.0–2.5)
ALT: 23 U/L (ref 9–46)
AST: 26 U/L (ref 10–35)
Albumin: 4.2 g/dL (ref 3.6–5.1)
Alkaline phosphatase (APISO): 50 U/L (ref 35–144)
BUN: 20 mg/dL (ref 7–25)
CO2: 29 mmol/L (ref 20–32)
Calcium: 9.4 mg/dL (ref 8.6–10.3)
Chloride: 103 mmol/L (ref 98–110)
Creat: 0.75 mg/dL (ref 0.70–1.18)
GFR, Est African American: 107 mL/min/{1.73_m2} (ref >=60)
GFR, Est Non African American: 92 mL/min/{1.73_m2} (ref >=60)
Globulin: 2.3 g/dL (calc) (ref 1.9–3.7)
Glucose, Bld: 97 mg/dL (ref 65–99)
Potassium: 4.7 mmol/L (ref 3.5–5.3)
Sodium: 141 mmol/L (ref 135–146)
Total Bilirubin: 0.8 mg/dL (ref 0.2–1.2)
Total Protein: 6.5 g/dL (ref 6.1–8.1)

## 2020-12-11 LAB — LIPID PANEL
Cholesterol: 174 mg/dL (ref <200)
HDL: 110 mg/dL (ref >=40)
LDL Cholesterol (Calc): 53 mg/dL (calc)
Non-HDL Cholesterol (Calc): 64 mg/dL (calc) (ref <130)
Total CHOL/HDL Ratio: 1.6 (calc) (ref <5.0)
Triglycerides: 38 mg/dL (ref <150)

## 2020-12-11 LAB — PSA: PSA: 6.21 ng/mL — ABNORMAL HIGH (ref <=4.0)

## 2020-12-11 LAB — HEMOGLOBIN A1C W/OUT EAG: Hgb A1c MFr Bld: 5.7 % of total Hgb — ABNORMAL HIGH (ref <5.7)

## 2020-12-15 ENCOUNTER — Other Ambulatory Visit: Payer: Self-pay

## 2020-12-15 DIAGNOSIS — I340 Nonrheumatic mitral (valve) insufficiency: Secondary | ICD-10-CM

## 2020-12-15 NOTE — Telephone Encounter (Signed)
Left message for pt to call back. Pt has been set up for TEE on May 6th at 7:30am. Case # [id number]. We need to provide instructions for this procedure.

## 2021-01-05 IMAGING — CT CT CARDIAC CORONARY ARTERY CALCIUM SCORE
3 series · 14 of 20 positions shown, 15 images · non-contrast
Comparison: None.
COMPARISON: None.

Addendum:
EXAM:
OVER-READ INTERPRETATION  CT CHEST

The following report is an over-read performed by radiologist Dr.
Edi Eduard Parapani [REDACTED] on 12/25/2019. This
over-read does not include interpretation of cardiac or coronary
anatomy or pathology. The coronary calcium score/coronary CTA
interpretation by the cardiologist is attached.
CLINICAL DATA: Risk stratification
Coronary Calcium Score
TECHNIQUE: The patient was scanned on a Siemens Force scanner. Axial
non-contrast 3 mm slices were carried out through the heart. The
data set was analyzed on a dedicated work station and scored using
the Agatson method.

[Series 2: casc 3.0 bv41 2 bestdiast 77 % · axial · 0.46mm/px · z∈[-245,-167]mm · 4 of 44 slices shown, 5 images]
[im 9/44  vessel]
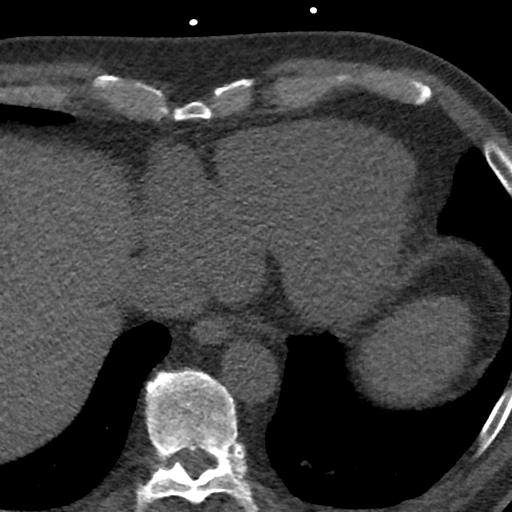
[im 9/44  lung]
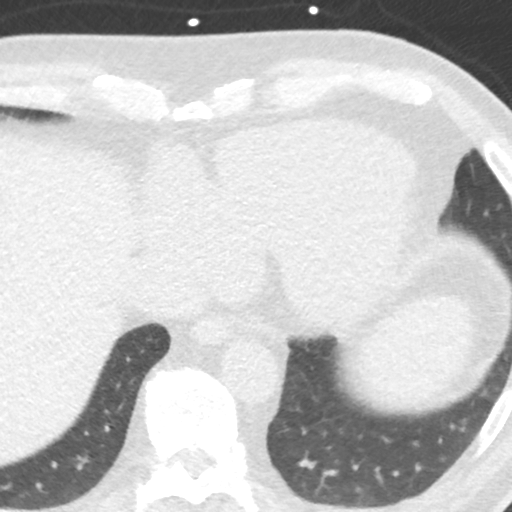
[im 18/44  vessel]
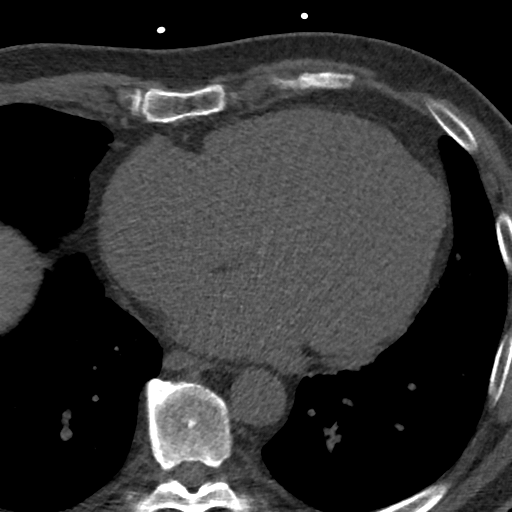
[im 26/44  vessel]
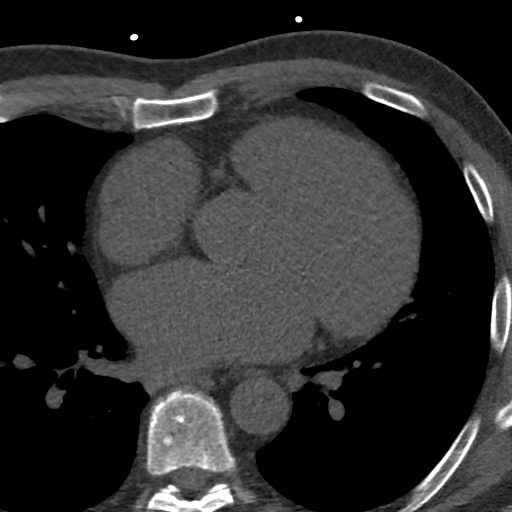
[im 35/44  vessel]
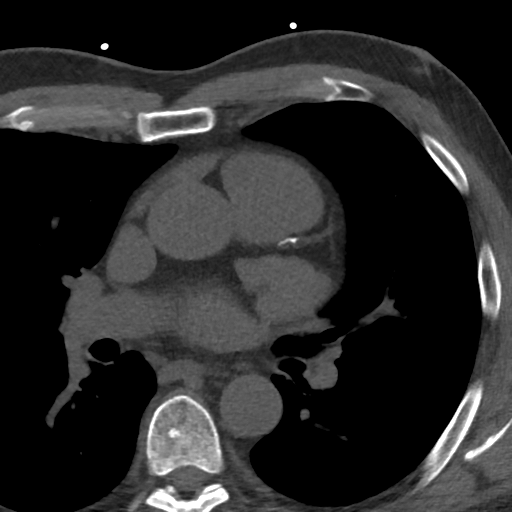

[Series 3: lung 77 % · axial · 0.75mm/px · z∈[-250,-164]mm · 5 of 45 slices shown]
[im 8/45  lung]
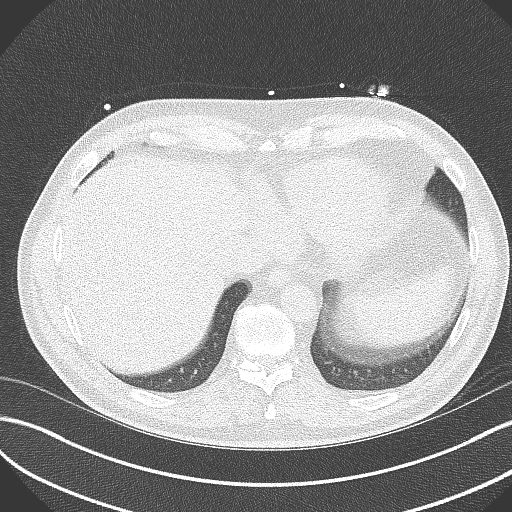
[im 15/45  lung]
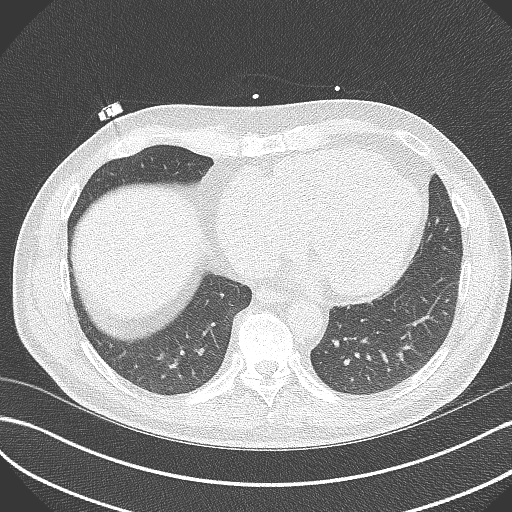
[im 23/45  lung]
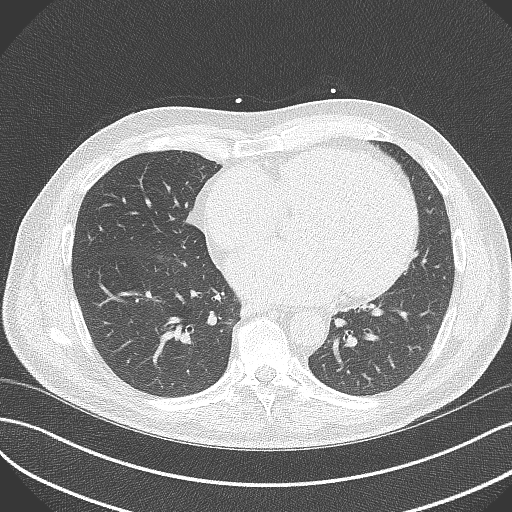
[im 30/45  lung]
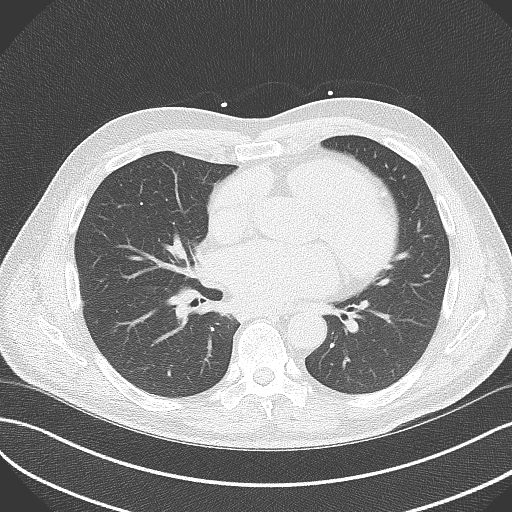
[im 37/45  lung]
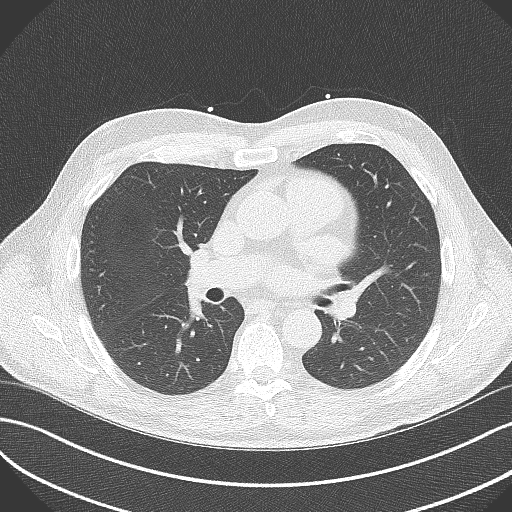

[Series 4: lung st 77 % · axial · 0.75mm/px · z∈[-250,-164]mm · 5 of 45 slices shown]
[im 8/45  lung]
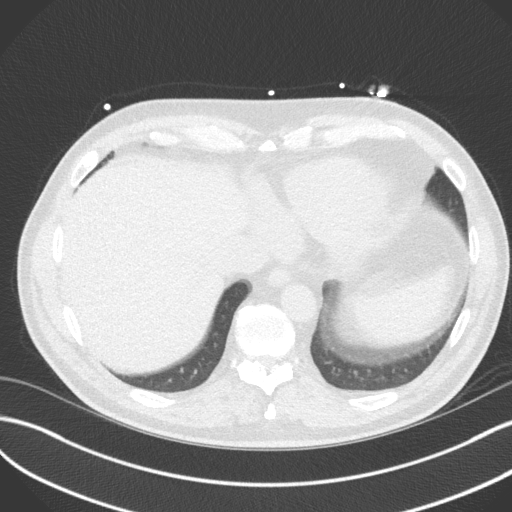
[im 15/45  lung]
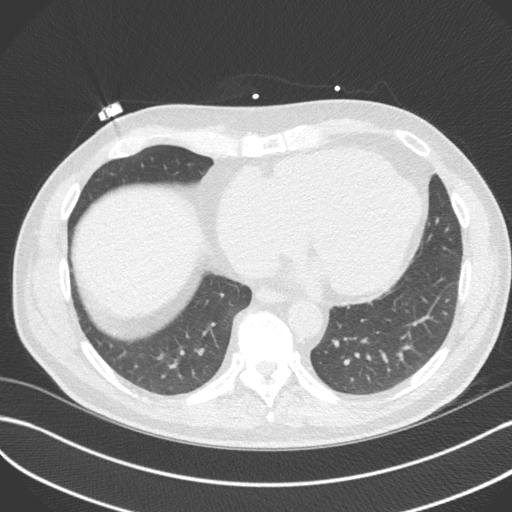
[im 23/45  lung]
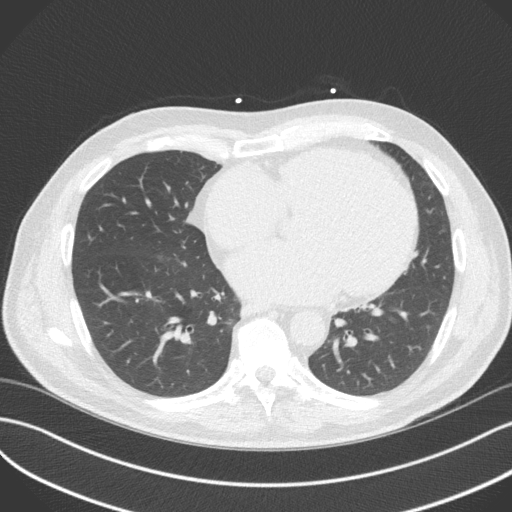
[im 30/45  lung]
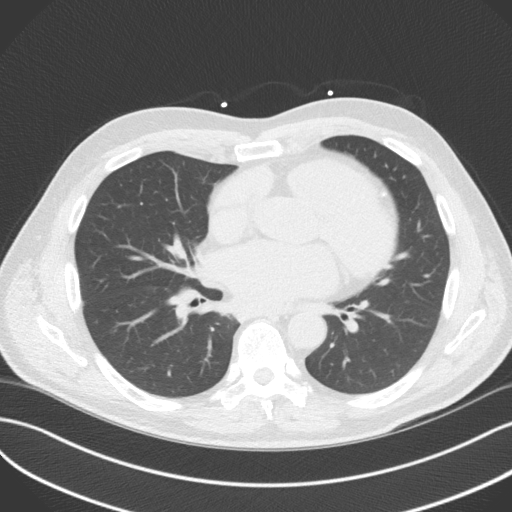
[im 37/45  lung]
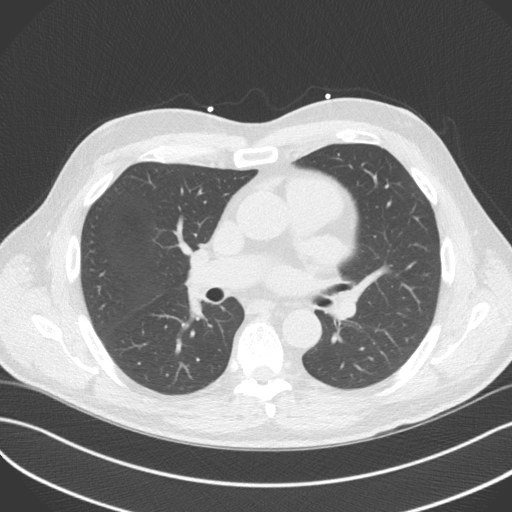

[14 of 20 positions shown; findings below may reference images not displayed]

FINDINGS: Within the visualized portions of the thorax there are no suspicious
appearing pulmonary nodules or masses, there is no acute
consolidative airspace disease, no pleural effusions, no
pneumothorax and no lymphadenopathy. Visualized portions of the
upper abdomen are unremarkable. There are no aggressive appearing
lytic or blastic lesions noted in the visualized portions of the
skeleton.
IMPRESSION: No significant incidental noncardiac findings are noted.
FINDINGS: Non-cardiac: See separate report from [REDACTED].

Ascending Aorta: Normal sized

Pericardium: Normal

Coronary arteries: Normal origin.
IMPRESSION: Coronary calcium score of 110 (coronary calcium only noted in the
LAD). This was 45 percentile for age and sex matched control.

Robert Anthony Aliaj,DO

*** End of Addendum ***
EXAM:
OVER-READ INTERPRETATION  CT CHEST

The following report is an over-read performed by radiologist Dr.
Edi Eduard Parapani [REDACTED] on 12/25/2019. This
over-read does not include interpretation of cardiac or coronary
anatomy or pathology. The coronary calcium score/coronary CTA
interpretation by the cardiologist is attached.
FINDINGS: Within the visualized portions of the thorax there are no suspicious
appearing pulmonary nodules or masses, there is no acute
consolidative airspace disease, no pleural effusions, no
pneumothorax and no lymphadenopathy. Visualized portions of the
upper abdomen are unremarkable. There are no aggressive appearing
lytic or blastic lesions noted in the visualized portions of the
skeleton.
IMPRESSION: No significant incidental noncardiac findings are noted.

## 2021-01-06 ENCOUNTER — Other Ambulatory Visit: Payer: Self-pay

## 2021-01-06 DIAGNOSIS — I340 Nonrheumatic mitral (valve) insufficiency: Secondary | ICD-10-CM | POA: Diagnosis not present

## 2021-01-07 LAB — CBC
Hematocrit: 40.6 % (ref 37.5–51.0)
Hemoglobin: 14.1 g/dL (ref 13.0–17.7)
MCH: 30.4 pg (ref 26.6–33.0)
MCHC: 34.7 g/dL (ref 31.5–35.7)
MCV: 88 fL (ref 79–97)
Platelets: 247 10*3/uL (ref 150–450)
RBC: 4.64 x10E6/uL (ref 4.14–5.80)
RDW: 12.9 % (ref 11.6–15.4)
WBC: 8 10*3/uL (ref 3.4–10.8)

## 2021-01-07 LAB — BASIC METABOLIC PANEL
BUN/Creatinine Ratio: 19 (ref 10–24)
BUN: 18 mg/dL (ref 8–27)
CO2: 25 mmol/L (ref 20–29)
Calcium: 9 mg/dL (ref 8.6–10.2)
Chloride: 103 mmol/L (ref 96–106)
Creatinine, Ser: 0.95 mg/dL (ref 0.76–1.27)
Glucose: 102 mg/dL — ABNORMAL HIGH (ref 65–99)
Potassium: 4.3 mmol/L (ref 3.5–5.2)
Sodium: 141 mmol/L (ref 134–144)
eGFR: 86 mL/min/{1.73_m2} (ref >59)

## 2021-01-09 ENCOUNTER — Ambulatory Visit (HOSPITAL_BASED_OUTPATIENT_CLINIC_OR_DEPARTMENT_OTHER): Payer: PPO

## 2021-01-09 ENCOUNTER — Other Ambulatory Visit: Payer: Self-pay

## 2021-01-09 ENCOUNTER — Ambulatory Visit (HOSPITAL_COMMUNITY): Payer: PPO | Admitting: Certified Registered Nurse Anesthetist

## 2021-01-09 ENCOUNTER — Encounter (HOSPITAL_COMMUNITY)
Admission: RE | Disposition: A | Discharge status: Home / Self Care | Payer: Self-pay | Source: Home / Self Care | Attending: Internal Medicine

## 2021-01-09 ENCOUNTER — Ambulatory Visit (HOSPITAL_COMMUNITY)
Admission: RE | Admit: 2021-01-09 | Discharge: 2021-01-09 | Disposition: A | Discharge status: Home / Self Care | Payer: PPO | Attending: Internal Medicine | Admitting: Internal Medicine

## 2021-01-09 DIAGNOSIS — Z79899 Other long term (current) drug therapy: Secondary | ICD-10-CM | POA: Diagnosis not present

## 2021-01-09 DIAGNOSIS — I081 Rheumatic disorders of both mitral and tricuspid valves: Secondary | ICD-10-CM | POA: Diagnosis not present

## 2021-01-09 DIAGNOSIS — I7300 Raynaud's syndrome without gangrene: Secondary | ICD-10-CM | POA: Diagnosis not present

## 2021-01-09 DIAGNOSIS — I4891 Unspecified atrial fibrillation: Secondary | ICD-10-CM | POA: Diagnosis not present

## 2021-01-09 DIAGNOSIS — I2510 Atherosclerotic heart disease of native coronary artery without angina pectoris: Secondary | ICD-10-CM | POA: Diagnosis not present

## 2021-01-09 DIAGNOSIS — Z8249 Family history of ischemic heart disease and other diseases of the circulatory system: Secondary | ICD-10-CM | POA: Insufficient documentation

## 2021-01-09 DIAGNOSIS — I4819 Other persistent atrial fibrillation: Secondary | ICD-10-CM | POA: Diagnosis not present

## 2021-01-09 DIAGNOSIS — J310 Chronic rhinitis: Secondary | ICD-10-CM | POA: Diagnosis not present

## 2021-01-09 HISTORY — PX: TEE WITHOUT CARDIOVERSION: SHX5443

## 2021-01-09 LAB — ECHO TEE
MV M vel: 4.7 m/s
MV Peak grad: 88.4 mmHg
Radius: 0.45 cm

## 2021-01-09 SURGERY — ECHOCARDIOGRAM, TRANSESOPHAGEAL
Anesthesia: Monitor Anesthesia Care

## 2021-01-09 MED ORDER — PROPOFOL 500 MG/50ML IV EMUL
INTRAVENOUS | Status: DC | PRN
  Administered 2021-01-09: 125 ug/kg/min via INTRAVENOUS

## 2021-01-09 MED ORDER — PROPOFOL 10 MG/ML IV BOLUS
INTRAVENOUS | Status: DC | PRN
  Administered 2021-01-09 (×2): 20 mg via INTRAVENOUS

## 2021-01-09 MED ORDER — LIDOCAINE 2% (20 MG/ML) 5 ML SYRINGE
INTRAMUSCULAR | Status: DC | PRN
  Administered 2021-01-09: 100 mg via INTRAVENOUS

## 2021-01-09 MED ORDER — SODIUM CHLORIDE 0.9 % IV SOLN: INTRAVENOUS | Status: DC | PRN

## 2021-01-09 NOTE — Transfer of Care (Signed)
Immediate Anesthesia Transfer of Care Note  Patient: Grant Griffin, Grant Griffin  Procedure(s) Performed: TRANSESOPHAGEAL ECHOCARDIOGRAM (TEE) (N/A )  Patient Location: Endoscopy Unit  Anesthesia Type:MAC  Level of Consciousness: awake, alert , patient cooperative and responds to stimulation  Airway & Oxygen Therapy: Patient Spontanous Breathing and Patient connected to nasal cannula oxygen  Post-op Assessment: Report given to RN and Post -op Vital signs reviewed and stable  Post vital signs: Reviewed and stable  Last Vitals:  Vitals Value Taken Time  BP 121/61 01/09/21 0856  Temp    Pulse 67 01/09/21 0857  Resp 18 01/09/21 0857  SpO2 98 % 01/09/21 0857  Vitals shown include unvalidated device data.  Last Pain:  Vitals:   01/09/21 0726  TempSrc: Oral  PainSc: 0-No pain         Complications: No complications documented.

## 2021-01-09 NOTE — Anesthesia Preprocedure Evaluation (Addendum)
Anesthesia Evaluation  Patient identified by MRN, date of birth, ID band Patient awake    Reviewed: Allergy & Precautions, NPO status , Patient's Chart, lab work & pertinent test results  Airway Mallampati: II  TM Distance: >3 FB Neck ROM: Full    Dental no notable dental hx. (+) Teeth Intact, Dental Advisory Given   Pulmonary neg pulmonary ROS,    Pulmonary exam normal        Cardiovascular + CAD  + dysrhythmias Atrial Fibrillation  Rhythm:Irregular Rate:Normal  01/2020 ECHO 1. Left ventricular ejection fraction, by estimation, is 55 to 60%. The  left ventricle has normal function. The left ventricle has no regional  wall motion abnormalities. Left ventricular diastolic function could not  be evaluated.  2. Right ventricular systolic function is normal. The right ventricular  size is mildly enlarged. There is normal pulmonary artery systolic  pressure. The estimated right ventricular systolic pressure is 36.6 mmHg.  3. Left atrial size was moderately dilated.  4. Right atrial size was moderately dilated.  5. Moderate mitral regurgitation that is likely atrial functional due to  annular dilation from atrial fibrillation. ERO 0.2 cm2. Rvol 32 cc. The  mitral valve is grossly normal. Moderate mitral valve regurgitation.  6. The aortic valve is tricuspid. Aortic valve regurgitation is not  visualized. No aortic stenosis is present.  7. The inferior vena cava is normal in size with greater than 50%  respiratory variability, suggesting right atrial pressure of 3 mmHg.   Neuro/Psych negative psych ROS   GI/Hepatic negative GI ROS, Neg liver ROS,   Endo/Other  negative endocrine ROS  Renal/GU negative Renal ROS  negative genitourinary   Musculoskeletal  (+) Arthritis ,   Abdominal Normal abdominal exam  (+)   Peds  Hematology   Anesthesia Other Findings   Reproductive/Obstetrics negative OB ROS                              Anesthesia Physical  Anesthesia Plan  ASA: III  Anesthesia Plan: MAC   Post-op Pain Management:    Induction: Intravenous  PONV Risk Score and Plan: 1 and Treatment may vary due to age or medical condition, Propofol infusion and TIVA  Airway Management Planned: Simple Face Mask and Natural Airway  Additional Equipment: None  Intra-op Plan:   Post-operative Plan: Extubation in OR  Informed Consent: I have reviewed the patients History and Physical, chart, labs and discussed the procedure including the risks, benefits and alternatives for the proposed anesthesia with the patient or authorized representative who has indicated his/her understanding and acceptance.       Plan Discussed with: Anesthesiologist and CRNA  Anesthesia Plan Comments:        Anesthesia Quick Evaluation

## 2021-01-09 NOTE — Discharge Instructions (Signed)
Transesophageal Echocardiogram Transesophageal echocardiogram (TEE) is a test that uses sound waves to take pictures of your heart. TEE is done by passing a small probe attached to a flexible tube down the part of the body that moves food from your mouth to your stomach (esophagus). The pictures give clear images of your heart. This can help your doctor see if there are problems with your heart. Tell a doctor about:  Any allergies you have.  All medicines you are taking. This includes vitamins, herbs, eye drops, creams, and over-the-counter medicines.  Any problems you or family members have had with anesthetic medicines.  Any blood disorders you have.  Any surgeries you have had.  Any medical conditions you have.  Any swallowing problems.  Whether you have or have had a blockage in the part of the body that moves food from your mouth to your stomach.  Whether you are pregnant or may be pregnant. What are the risks? In general, this is a safe procedure. But, problems may occur, such as:  Damage to nearby structures or organs.  A tear in the part of the body that moves food from your mouth to your stomach.  Irregular heartbeat.  Hoarse voice or trouble swallowing.  Bleeding. What happens before the procedure? Medicines  Ask your doctor about changing or stopping: ? Your normal medicines. ? Vitamins, herbs, and supplements. ? Over-the-counter medicines.  Do not take aspirin or ibuprofen unless you are told to. General instructions  Follow instructions from your doctor about what you cannot eat or drink.  You will take out any dentures or dental retainers.  Plan to have a responsible adult take you home from the hospital or clinic.  Plan to have a responsible adult care for you for the time you are told after you leave the hospital or clinic. This is important. What happens during the procedure?  An IV will be put into one of your veins.  You may be given: ? A  sedative. This medicine helps you relax. ? A medicine to numb the back of your throat. This may be sprayed or gargled.  Your blood pressure, heart rate, and breathing will be watched.  You may be asked to lie on your left side.  A bite block will be placed in your mouth. This keeps you from biting the tube.  The tip of the probe will be placed into the back of your mouth.  You will be asked to swallow.  Your doctor will take pictures of your heart.  The probe and bite block will be taken out after the test is done. The procedure may vary among doctors and hospitals.   What can I expect after the procedure?  You will be monitored until you leave the hospital or clinic. This includes checking your blood pressure, heart rate, breathing rate, and blood oxygen level.  Your throat may feel sore and numb. This will get better over time. You will not be allowed to eat or drink until the numbness has gone away.  It is common to have a sore throat for a day or two.  It is up to you to get the results of your procedure. Ask how to get your results when they are ready. Follow these instructions at home:  If you were given a sedative during your procedure, do not drive or use machines until your doctor says that it is safe.  Return to your normal activities when your doctor says that it is safe.    Keep all follow-up visits. Summary  TEE is a test that uses sound waves to take pictures of your heart.  You will be given a medicine to help you relax.  Do not drive or use machines until your doctor says that it is safe. This information is not intended to replace advice given to you by your health care provider. Make sure you discuss any questions you have with your health care provider. Document Revised: 04/15/2020 Document Reviewed: 04/15/2020 Elsevier Patient Education  2021 [employer]  

## 2021-01-09 NOTE — Anesthesia Postprocedure Evaluation (Signed)
Anesthesia Post Note  Patient: [name], [name]  Procedure(s) Performed: TRANSESOPHAGEAL ECHOCARDIOGRAM (TEE) (N/A )     Patient location during evaluation: PACU Anesthesia Type: MAC Level of consciousness: awake and alert Pain management: pain level controlled Vital Signs Assessment: post-procedure vital signs reviewed and stable Respiratory status: spontaneous breathing, nonlabored ventilation and respiratory function stable Cardiovascular status: stable and blood pressure returned to baseline Postop Assessment: no apparent nausea or vomiting Anesthetic complications: no   No complications documented.  Last Vitals:  Vitals:   01/09/21 0906 01/09/21 0916  BP: 91/74 110/87  Pulse: 65 (!) 50  Resp: 14 (!) 21  Temp:    SpO2: 98% 95%    Last Pain:  Vitals:   01/09/21 0916  TempSrc:   PainSc: 0-No pain                 Merlinda Frederick

## 2021-01-09 NOTE — H&P (Signed)
Cardiology Admission History and Physical:   Patient ID: Grant Griffin, Grant Griffin MRN: [id number]; DOB: 1951-08-26   Admission date: 01/09/2021  PCP:  Grant Griffin, Grant Griffin   [facility] Providers Cardiologist:  Grant Griffin, Grant Griffin        Chief Complaint:  Presents for evaluation of mitral regurgitation  Here for TEE  Patient Profile:   Grant Griffin, Grant Griffin is a 70 y.o. male with hx of atrial fibrillation and MR who is being seen 01/09/2021 for the evaluation of MR by TEE  History of Present Illness:   Grant Griffin is a 70 yo with hx of atrial fibrillation and mitral regurgitation   Echo in April 2021 with mod MR    Felt to be atrialized MR    Here for TEE to further evaluate  Pt denies CP   No SOB  NO palpitations    Past Medical History:  Diagnosis Date  . Biatrial enlargement   . Moderate mitral regurgitation   . Persistent atrial fibrillation ([name]   . Raynaud's disease     Past Surgical History:  Procedure Laterality Date  . CARDIOVERSION N/A 01/11/2020   Procedure: CARDIOVERSION;  Surgeon: Grant Griffin, Grant Griffin;  Location: [facility] ENDOSCOPY;  Service: Cardiovascular;  Laterality: N/A;  . COLONOSCOPY    . ROTATOR CUFF REPAIR Left 2004     Medications Prior to Admission: Prior to Admission medications   Medication Sig Start Date End Date Taking? Authorizing Provider  atorvastatin (LIPITOR) 20 MG tablet TAKE 1 TABLET(20 MG) BY MOUTH DAILY Patient taking differently: Take 20 mg by mouth daily with supper. 11/20/20  Yes Grant Griffin, Grant Griffin  Propylene Glycol (SYSTANE COMPLETE OP) Place 1 drop into both eyes daily as needed (Allergy eyes).   Yes Provider, Historical, Grant Griffin  XARELTO 20 MG TABS tablet TAKE 1 TABLET(20 MG) BY MOUTH DAILY WITH SUPPER Patient taking differently: Take 20 mg by mouth daily with supper. 09/09/20  Yes Grant Griffin, Grant Griffin     Allergies:   No Known Allergies  Social History:   Social History   Socioeconomic History  . Marital status: Married    Spouse name: Not on file  .  Number of children: Not on file  . Years of education: Not on file  . Highest education level: Not on file  Occupational History  . Not on file  Tobacco Use  . Smoking status: Never Smoker  . Smokeless tobacco: Never Used  Vaping Use  . Vaping Use: Never used  Substance and Sexual Activity  . Alcohol use: Yes    Comment: socially  . Drug use: No  . Sexual activity: Not on file  Other Topics Concern  . Not on file  Social History Narrative   Married.  Two children.  Retired Pharmacist, community.  Lives in [place] Strain: Not on file  Food Insecurity: Not on file  Transportation Needs: Not on file  Physical Activity: Not on file  Stress: Not on file  Social Connections: Not on file  Intimate Partner Violence: Not on file    Family History:   The patient's family history includes Heart disease (age of onset: 46) in his father. There is no history of Colon cancer, Esophageal cancer, Stomach cancer, or Rectal cancer.    ROS:  Please see the history of present illness.  All other ROS reviewed and negative.     Physical Exam/Data:   Vitals:   01/09/21 0726  BP:  133/76  Pulse: (!) 56  Resp: 16  Temp: 98.5 F (36.9 C)  TempSrc: Oral  SpO2: 98%  Weight: 82.1 kg  Height: 5\' 8"  (1.727 m)   No intake or output data in the 24 hours ending 01/09/21 0757 Last 3 Weights 01/09/2021 12/09/2020 06/23/2020  Weight (lbs) 181 lb 185 lb 187 lb 4.8 oz  Weight (kg) 82.101 kg 83.915 kg 84.959 kg     Body mass index is 27.52 kg/m.  General:  Well nourished, well developed, in no acute distress HEENT: normal Lymph: no adenopathy Neck: no JVD Endocrine:  No thryomegaly Vascular: No carotid bruits; FA pulses 2+ bilaterally without bruits  Cardiac:  normal S1, S2;  Irreg rate/rhythm   Lungs:  clear to auscultation bilaterally, no wheezing, rhonchi or rales  Abd: soft, nontender, no hepatomegaly  Ext: no LE  edema Musculoskeletal:  No  deformities,  Skin: warm and dry  Neuro:  CNs 2-12 intact, no focal abnormalities noted Psych:  Normal affect    EKG:  The ECG that wasnot done   Relevant CV Studies:  IMPRESSIONS    1. Left ventricular ejection fraction, by estimation, is 55 to 60%. The  left ventricle has normal function. The left ventricle has no regional  wall motion abnormalities. Left ventricular diastolic function could not  be evaluated.  2. Right ventricular systolic function is normal. The right ventricular  size is mildly enlarged. There is normal pulmonary artery systolic  pressure. The estimated right ventricular systolic pressure is 50.9 mmHg.  3. Left atrial size was moderately dilated.  4. Right atrial size was moderately dilated.  5. Moderate mitral regurgitation that is likely atrial functional due to  annular dilation from atrial fibrillation. ERO 0.2 cm2. Rvol 32 cc. The  mitral valve is grossly normal. Moderate mitral valve regurgitation.  6. The aortic valve is tricuspid. Aortic valve regurgitation is not  visualized. No aortic stenosis is present.  7. The inferior vena cava is normal in size with greater than 50%  respiratory variability, suggesting right atrial pressure of 3 mmHg.   FINDINGS  Left Ventricle: Left ventricular ejection fraction, by estimation, is 55  to 60%. The left ventricle has normal function. The left ventricle has no  regional wall motion abnormalities. The left ventricular internal cavity  size was normal in size. There is  no left ventricular hypertrophy. Left ventricular diastolic function  could not be evaluated due to atrial fibrillation. Left ventricular  diastolic function could not be evaluated.   Right Ventricle: The right ventricular size is mildly enlarged. No  increase in right ventricular wall thickness. Right ventricular systolic  function is normal. There is normal pulmonary artery systolic pressure.  The tricuspid regurgitant velocity is  2.48  m/s, and with an assumed right atrial pressure of 3 mmHg, the estimated  right ventricular systolic pressure is 32.6 mmHg.   Left Atrium: Left atrial size was moderately dilated.   Right Atrium: Right atrial size was moderately dilated.   Pericardium: Trivial pericardial effusion is present. The pericardial  effusion is circumferential. Presence of pericardial fat pad.   Mitral Valve: Moderate mitral regurgitation that is likely atrial  functional due to annular dilation from atrial fibrillation. ERO 0.2 cm2.  Rvol 32 cc. The mitral valve is grossly normal. There is mild thickening  of the anterior and posterior mitral  valve leaflet(s). Moderate mitral valve regurgitation.   Tricuspid Valve: The tricuspid valve is grossly normal. Tricuspid valve  regurgitation is mild . No evidence  of tricuspid stenosis.   Aortic Valve: The aortic valve is tricuspid. Aortic valve regurgitation is  not visualized. No aortic stenosis is present.   Pulmonic Valve: The pulmonic valve was grossly normal. Pulmonic valve  regurgitation is trivial. No evidence of pulmonic stenosis.   Aorta: The aortic root and ascending aorta are structurally normal, with  no evidence of dilitation.   Venous: The inferior vena cava is normal in size with greater than 50%  respiratory variability, suggesting right atrial pressure of 3 mmHg.   IAS/Shunts: The atrial septum is grossly normal.     LEFT VENTRICLE  PLAX 2D  LVIDd:     5.09 cm Diastology  LVIDs:     3.08 cm LV e' lateral:  15.30 cm/s  LV PW:     1.44 cm LV E/e' lateral: 5.5  LV IVS:    0.99 cm LV e' medial:  9.46 cm/s  LVOT diam:   2.10 cm LV E/e' medial: 8.8  LV SV:     65  LV SV Index:  33  LVOT Area:   3.46 cm     RIGHT VENTRICLE  RV Basal diam: 4.00 cm  RV S prime:   11.30 cm/s  TAPSE (M-mode): 1.6 cm  RVSP:      27.6 mmHg   LEFT ATRIUM       Index    RIGHT ATRIUM      Index   LA diam:    4.90 cm 2.43 cm/m RA Pressure: 3.00 mmHg  LA Vol (A2C):  87.3 ml 43.34 ml/m RA Area:   26.40 cm  LA Vol (A4C):  89.3 ml 44.34 ml/m RA Volume:  86.40 ml 42.90 ml/m  LA Biplane Vol: 90.1 ml 44.73 ml/m  AORTIC VALVE  LVOT Vmax:  99.00 cm/s  LVOT Vmean: 66.600 cm/s  LVOT VTI:  0.189 m    AORTA  Ao Root diam: 3.30 cm   MITRAL VALVE         TRICUSPID VALVE  MV Area (PHT):        TR Peak grad:  24.6 mmHg  MV Decel Time:        TR Vmax:    248.00 cm/s  MR Peak grad:  87.2 mmHg  Estimated RAP: 3.00 mmHg  MR Mean grad:  59.0 mmHg  RVSP:      27.6 mmHg  MR Vmax:     467.00 cm/s  MR Vmean:    365.0 cm/s SHUNTS  MR PISA:     3.08 cm  Systemic VTI: 0.19 m  MR PISA Eff ROA: 20 mm   Systemic Diam: 2.10 cm  MR PISA Radius: 0.70 cm  MV E velocity: 83.45 cm/s   Eleonore Chiquito Grant Griffin  Electronically signed by Eleonore Chiquito Grant Griffin  Signature Date/Time: 12/25/2019/10:36:55 AM    Laboratory Data:  High Sensitivity Troponin:  No results for input(s): TROPONINIHS in the last 720 hours.    Chemistry Recent Labs  Lab 01/06/21 1452  NA 141  K 4.3  CL 103  CO2 25  GLUCOSE 102*  BUN 18  CREATININE 0.95  CALCIUM 9.0    No results for input(s): PROT, ALBUMIN, AST, ALT, ALKPHOS, BILITOT in the last 168 hours. Hematology Recent Labs  Lab 01/06/21 1447  WBC 8.0  RBC 4.64  HGB 14.1  HCT 40.6  MCV 88  MCH 30.4  MCHC 34.7  RDW 12.9  PLT 247   BNPNo results for input(s): BNP, PROBNP in the last 168 hours.  DDimer  No results for input(s): DDIMER in the last 168 hours.   Radiology/Studies:  No results found.   Assessment and Plan:   Pt is a 70 yo with hx of atrial fibrillation and MR   Here for a TEE to evaluate MR further   Procedure described    Pt understands and agrees to proceed.     COntinue other meds    For questions or updates, please contact [place] Please consult [email or web address]  for contact info under     Signed, Dorris Carnes, Grant Griffin  01/09/2021 7:57 AM

## 2021-01-09 NOTE — CV Procedure (Signed)
TEE  Patient sedated by Lorin Picket with Propofol intravenously Bite guard placed    TEE probe advanced to mid esophagus without difficulty   LV, RV normal sizes,   LVEF and RVEF normal Prominent PFO present by color doppler with fairly continuous flow seen through foramen  MV appears normal    By color doppler there are multiple jets of central MR with 2 predominant.    Several 3D data sets taken for analysis    Overall, MR appears moderate on initial evaluation TV normal   MIld TR   PAP est is normal AV is normal   No AI PV normal   No PI   Thoracic aorta is normal   Full report to follow in CV section of chart

## 2021-01-11 ENCOUNTER — Encounter (HOSPITAL_COMMUNITY): Payer: Self-pay | Admitting: Internal Medicine

## 2021-01-15 DIAGNOSIS — I482 Chronic atrial fibrillation, unspecified: Secondary | ICD-10-CM | POA: Insufficient documentation

## 2021-01-15 DIAGNOSIS — I2510 Atherosclerotic heart disease of native coronary artery without angina pectoris: Secondary | ICD-10-CM | POA: Insufficient documentation

## 2021-01-15 NOTE — Progress Notes (Signed)
Cardiology Office Note  Already Date:  01/16/2021   ID:  Grant Griffin, [name], DOB December 16, 1950, MRN [id number]  PCP:  Elby Showers, MD  Cardiologist:   Minus Breeding, MD Referring:  Elby Showers, MD  Chief Complaint  Patient presents with  . 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  At the last visit I set him up for a DCCV.  I sent him for an echo which showed moderate MR.   He had an elevated coronary calcium as well on CT with a score of 110 which was 45% for his age and gender.  This demonstrated 25 to 49% proximal LAD stenosis.  He had cardioversion on 01/11/2020 and was in sinus rhythm following this.  However, he was back in atrial fibrillation on follow-up in late May.    He has MR and I followed him with a TEE.  It was moderate.     Since I last saw him he has done well.  The patient denies any new symptoms such as chest discomfort, neck or arm discomfort. There has been no new shortness of breath, PND or orthopnea. There have been no reported palpitations, presyncope or syncope.     Past Medical History:  Diagnosis Date  . Biatrial enlargement   . Moderate mitral regurgitation   . Persistent atrial fibrillation ([place])   . Raynaud's disease     Past Surgical History:  Procedure Laterality Date  . CARDIOVERSION N/A 01/11/2020   Procedure: CARDIOVERSION;  Surgeon: Fay Records, MD;  Location[facility] ENDOSCOPY;  Service: Cardiovascular;  Laterality: N/A;  . COLONOSCOPY    . ROTATOR CUFF REPAIR Left 2004  . TEE WITHOUT CARDIOVERSION N/A 01/09/2021   Procedure: TRANSESOPHAGEAL ECHOCARDIOGRAM (TEE);  Surgeon: Fay Records, MD;  Location: [facility] ENDOSCOPY;  Service: Cardiovascular;  Laterality: N/A;     Current Outpatient Medications  Medication Sig Dispense Refill  . atorvastatin (LIPITOR) 20 MG tablet Take 20 mg by mouth daily. 1 Tablet Daily     . Propylene Glycol (SYSTANE COMPLETE OP) Place 1 drop into both eyes daily as needed (Allergy eyes).    . rivaroxaban (XARELTO) 20 MG TABS tablet Take 20 mg by mouth daily with supper. 1 Tablet Daily with Super     No current facility-administered medications for this visit.    Allergies:   Patient has no known allergies.    ROS:  Please see the history of present illness.   Otherwise, review of systems are positive for none  .   All other systems are reviewed and negative.    PHYSICAL EXAM: VS:  BP 120/78   Pulse (!) 56   Ht 5\' 8"  (1.727 m)   Wt 187 lb (84.8 kg)   SpO2 98%   BMI 28.43 kg/m  , BMI Body mass index is 28.43 kg/m. GENERAL:  Well appearing NECK:  No jugular venous distention, waveform within normal limits, carotid upstroke brisk and symmetric, no bruits, no thyromegaly LUNGS:  Clear to auscultation bilaterally CHEST:  Unremarkable HEART:  PMI not displaced or sustained,S1 and S2 within normal limits, no S3, no S4, no clicks, no rubs, slight apical systolic murmur radiating slightly to the axilla in the left lateral position only, no diastolic murmurs ABD:  Flat, positive bowel sounds normal in frequency in pitch, no  bruits, no rebound, no guarding, no midline pulsatile mass, no hepatomegaly, no splenomegaly EXT:  2 plus pulses throughout, no edema, no cyanosis no clubbing    EKG:  EKG is not ordered today.    Recent Labs: 12/05/2020: ALT 23 01/06/2021: BUN 18; Creatinine, Ser 0.95; Hemoglobin 14.1; Platelets 247; Potassium 4.3; Sodium 141    Lipid Panel    Component Value Date/Time   CHOL 174 12/05/2020 0948   CHOL 159 05/21/2020 0825   TRIG 38 12/05/2020 0948   HDL 110 12/05/2020 0948   HDL 104 05/21/2020 0825   CHOLHDL 1.6 12/05/2020 0948   VLDL 9 12/04/2015 0911   LDLCALC 53 12/05/2020 0948   LDLDIRECT 101.2 09/03/2009 0000      Wt Readings from Last 3 Encounters:  01/16/21 187 lb (84.8 kg)  01/09/21 181 lb (82.1 kg)  12/09/20 185 lb (83.9 kg)       Other studies Reviewed: Additional studies/ records that were reviewed today include: TEE Review of the above records demonstrates:  Please see elsewhere in the note.     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  He has a slow ventricular rate and really does not notice this. 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  With patient activation we discussed anticoagulation in previous appointments and he with this discussion chooses to continue Xarelto and support this.   MR:   I will check this with an echocardiogram in April.   CAD:   He has nonobstructive coronary disease. He is  MESA score is 7.6.  He will continue with risk reduction.  DYSLIPIDEMIA:   Lipitor with he had an excellent response to low-dose Lipitor with an LDL of 53.  No change in therapy.   Current medicines are reviewed at length with the patient today.  The patient does not have concerns regarding medicines.  The following changes have been made:     No change  Labs/ tests ordered today include:    Orders Placed This Encounter  Procedures  . ECHOCARDIOGRAM COMPLETE     Disposition:   Follow up with me in one year.    Signed, Minus Breeding, MD  01/16/2021 9:00 AM    [place]

## 2021-01-16 ENCOUNTER — Encounter: Payer: Self-pay | Admitting: Cardiology

## 2021-01-16 ENCOUNTER — Other Ambulatory Visit: Payer: Self-pay

## 2021-01-16 ENCOUNTER — Ambulatory Visit: Payer: PPO | Admitting: Cardiology

## 2021-01-16 VITALS — BP 120/78 | HR 56 | Ht 68.0 in | Wt 187.0 lb

## 2021-01-16 DIAGNOSIS — I340 Nonrheumatic mitral (valve) insufficiency: Secondary | ICD-10-CM

## 2021-01-16 DIAGNOSIS — I482 Chronic atrial fibrillation, unspecified: Secondary | ICD-10-CM

## 2021-01-16 DIAGNOSIS — I2510 Atherosclerotic heart disease of native coronary artery without angina pectoris: Secondary | ICD-10-CM | POA: Diagnosis not present

## 2021-01-16 NOTE — Patient Instructions (Signed)
  Testing/Procedures:  Your physician has requested that you have an echocardiogram. Echocardiography is a painless test that uses sound waves to create images of your heart. It provides your doctor with information about the size and shape of your heart and how well your heart's chambers and valves are working. This procedure takes approximately one hour. There are no restrictions for this procedure.[place] April 2023     Follow-Up: At [facility], you and your health needs are our priority.  As part of our continuing mission to provide you with exceptional heart care, we have created designated Provider Care Teams.  These Care Teams include your primary Cardiologist (physician) and Advanced Practice Providers (APPs -  Physician Assistants and Nurse Practitioners) who all work together to provide you with the care you need, when you need it.  We recommend signing up for the patient portal called "MyChart".  Sign up information is provided on this After Visit Summary.  MyChart is used to connect with patients for Virtual Visits (Telemedicine).  Patients are able to view lab/test results, encounter notes, upcoming appointments, etc.  Non-urgent messages can be sent to your provider as well.   To learn more about what you can do with MyChart, go to [email or web address].    Your next appointment:   AFTER ECHO COMPLETE IN April 2023  The format for your next appointment:   In Person  Provider:   Minus Breeding, MD

## 2021-01-21 DIAGNOSIS — R972 Elevated prostate specific antigen [PSA]: Secondary | ICD-10-CM | POA: Diagnosis not present

## 2021-01-21 DIAGNOSIS — R351 Nocturia: Secondary | ICD-10-CM | POA: Diagnosis not present

## 2021-01-31 ENCOUNTER — Encounter: Payer: Self-pay | Admitting: Internal Medicine

## 2021-01-31 NOTE — Patient Instructions (Signed)
Referral back to Dr. Diona Fanti regarding elevated PSA.  Close follow-up with Dr. Percival Spanish regarding elevated coronary calcium score and mitral regurgitation.  He has a history of atrial fib and is on chronic anticoagulation.  Have COVID booster in the future particularly if traveling.  Follow-up in 1 year or as needed.  Colonoscopy is up-to-date.

## 2021-04-22 ENCOUNTER — Telehealth: Payer: Self-pay | Admitting: *Deleted

## 2021-04-22 NOTE — Telephone Encounter (Signed)
   [facility] Pre-operative Risk Assessment    Patient Name: JOREN REHM, [name]  DOB: Mar 15, 1951 MRN: [id number]  HEARTCARE STAFF:  - IMPORTANT!!!!!! Under Visit Info/Reason for Call, type in Other and utilize the format Clearance MM/DD/YY or Clearance TBD. Do not use dashes or single digits. - Please review there is not already an duplicate clearance open for this procedure. - If request is for dental extraction, please clarify the # of teeth to be extracted. - If the patient is currently at the dentist's office, call Pre-Op Callback Staff (MA/nurse) to input urgent request.  - If the patient is not currently in the dentist office, please route to the Pre-Op pool.  Request for surgical clearance:  What type of surgery is being performed?  PROSTATE BIOPSY  When is this surgery scheduled?  05/06/2021  What type of clearance is required (medical clearance vs. Pharmacy clearance to hold med vs. Both)?  BOTH  Are there any medications that need to be held prior to surgery and how long?  [facility]  Practice name and name of physician performing surgery?  [facility] / DR. DAHLSTEDT  What is the office phone number?  [phone]   7.   What is the office fax number?  [phone]  8.   Anesthesia type (None, local, MAC, general) ?     Jeanann Lewandowsky 04/22/2021, 12:19 PM  _________________________________________________________________   (provider comments below)

## 2021-04-22 NOTE — Telephone Encounter (Signed)
Left VM

## 2021-04-22 NOTE — Telephone Encounter (Signed)
Patient with diagnosis of A Fib on Xarelto for anticoagulation.    Procedure: prostate biopsy Date of procedure: 05/06/21   CHA2DS2-VASc Score = 2  This indicates a 2.2% annual risk of stroke. The patient's score is based upon: CHF History: No HTN History: No Diabetes History: No Stroke History: No Vascular Disease History: Yes Age Score: 1 Gender Score: 0   CrCl 86 mL/min Platelet count 247K   Per office protocol, patient can hold Xarelto for 3 days prior to procedure.     Patient should restart Xarelto on the evening of procedure or day after, at discretion of procedure MD

## 2021-04-23 ENCOUNTER — Telehealth: Payer: Self-pay | Admitting: Cardiology

## 2021-04-23 NOTE — Telephone Encounter (Signed)
Follow Up:     Patient is retuning your call.

## 2021-04-23 NOTE — Telephone Encounter (Signed)
Patient was returning call to [facility] about medical clearance.

## 2021-04-24 NOTE — Telephone Encounter (Signed)
Left message to call back at [phone] on 04/24/2021.  Patient needs call back.

## 2021-04-27 NOTE — Telephone Encounter (Signed)
   Name: XYAN FAVINGER, [name]  DOB: 1951/05/10  MRN: [id number]   Primary Cardiologist: Minus Breeding, MD  Chart reviewed as part of pre-operative protocol coverage. Patient was contacted 04/27/2021 in reference to pre-operative risk assessment for pending surgery as outlined below.  [name], [name] was last seen on 01/16/21 by Dr. Percival Spanish. Chart reviewed - pertinent cardiac hx includes atrial fib, mild nonobstructive CAD by coronary CTA 02/2020, and moderate mitral regurgitation. Normal LV function by TEE. I reached out to patient for update on how he is doing. The patient affirms he has been doing well without any new cardiac symptoms. Therefore, based on ACC/AHA guidelines, the patient would be at acceptable risk for the planned procedure without further cardiovascular testing. The patient was advised that if he develops new symptoms prior to surgery to contact our office to arrange for a follow-up visit, and he verbalized understanding.  PharmD team reviewed anticoagulation and recommends the following: "Per office protocol, patient can hold Xarelto for 3 days prior to procedure.  Patient should restart Xarelto on the evening of procedure or day after, at discretion of procedure MD."  I will route this recommendation to the requesting party via [place] fax function and remove from pre-op pool. Please call with questions.  Charlie Pitter, PA-C 04/27/2021, 8:55 AM

## 2021-04-27 NOTE — Telephone Encounter (Signed)
Pt is returning a call  

## 2021-04-27 NOTE — Telephone Encounter (Signed)
Duplicate clearance. See other note.

## 2021-05-06 DIAGNOSIS — C61 Malignant neoplasm of prostate: Secondary | ICD-10-CM | POA: Diagnosis not present

## 2021-05-06 DIAGNOSIS — R972 Elevated prostate specific antigen [PSA]: Secondary | ICD-10-CM | POA: Diagnosis not present

## 2021-05-12 ENCOUNTER — Encounter: Payer: Self-pay | Admitting: Internal Medicine

## 2021-05-12 DIAGNOSIS — L814 Other melanin hyperpigmentation: Secondary | ICD-10-CM | POA: Diagnosis not present

## 2021-05-12 DIAGNOSIS — Z85828 Personal history of other malignant neoplasm of skin: Secondary | ICD-10-CM | POA: Diagnosis not present

## 2021-05-12 DIAGNOSIS — L280 Lichen simplex chronicus: Secondary | ICD-10-CM | POA: Diagnosis not present

## 2021-05-12 DIAGNOSIS — L821 Other seborrheic keratosis: Secondary | ICD-10-CM | POA: Diagnosis not present

## 2021-05-12 DIAGNOSIS — D485 Neoplasm of uncertain behavior of skin: Secondary | ICD-10-CM | POA: Diagnosis not present

## 2021-05-12 DIAGNOSIS — L570 Actinic keratosis: Secondary | ICD-10-CM | POA: Diagnosis not present

## 2021-05-20 DIAGNOSIS — N3289 Other specified disorders of bladder: Secondary | ICD-10-CM | POA: Diagnosis not present

## 2021-05-20 DIAGNOSIS — C61 Malignant neoplasm of prostate: Secondary | ICD-10-CM | POA: Diagnosis not present

## 2021-05-20 DIAGNOSIS — E2749 Other adrenocortical insufficiency: Secondary | ICD-10-CM | POA: Diagnosis not present

## 2021-05-21 DIAGNOSIS — H18513 Endothelial corneal dystrophy, bilateral: Secondary | ICD-10-CM | POA: Diagnosis not present

## 2021-05-21 DIAGNOSIS — Z961 Presence of intraocular lens: Secondary | ICD-10-CM | POA: Diagnosis not present

## 2021-05-29 ENCOUNTER — Other Ambulatory Visit: Payer: Self-pay | Admitting: Cardiology

## 2021-05-29 NOTE — Telephone Encounter (Signed)
Prescription refill request for Xarelto received.  Indication:atrial fib Last office visit:5/22 Weight:84.8 kg Age:70 Scr:0.9 CrCl:90.3 ml/min  Prescription refilled

## 2021-06-08 DIAGNOSIS — C61 Malignant neoplasm of prostate: Secondary | ICD-10-CM | POA: Diagnosis not present

## 2021-06-23 ENCOUNTER — Telehealth: Payer: Self-pay | Admitting: Radiation Oncology

## 2021-06-24 ENCOUNTER — Telehealth: Payer: Self-pay | Admitting: Radiation Oncology

## 2021-07-06 NOTE — Progress Notes (Signed)
Radiation Oncology         [phone] ________________________________  Initial Outpatient Consultation  Name: Grant Griffin, [name] MRN: [id number]  Date: 07/07/2021  DOB: [birth year]  CC:Elby Showers, MD  Franchot Gallo, MD   REFERRING PHYSICIAN: Franchot Gallo, MD  DIAGNOSIS: 70 y.o. gentleman with Stage T1c adenocarcinoma of the prostate with Gleason score of 4+3, and PSA of 7.22.    ICD-10-CM   1. Malignant neoplasm of prostate ([place])  C61       HISTORY OF PRESENT ILLNESS: Grant Griffin, [name] is a 71 y.o. male with a diagnosis of prostate cancer. He has been followed by Dr. Diona Fanti since at least 2016 for BPH and prostate cancer screening. His PSA was also followed by his PCP. His PSA increased from 2.22 in 11/2015 to 3.8 in 08/2017, but remained stable around 3.8 through 03/2019 and gradually increasing since that time as below: 03/2019 - 3.87 10/2019 - 4.6 (PCP) 04/2020 - 4.9 (PCP) 12/2020 - 6.21 01/2021 - 7.22  Digital rectal examination was performed at at follow up with Dr. Diona Fanti on 01/21/21 and was without any concerning findings or nodules.  The patient proceeded to transrectal ultrasound with 12 biopsies of the prostate on 05/06/21.  The prostate volume measured 39.29 cc.  Out of 12 core biopsies, 4 were positive.  The maximum Gleason score was 4+3, and this was seen in the right base lateral (with perineural invasion) and left base lateral. Additionally, Gleason 3+4 was seen in the right base and right mid lateral (both with PNI).  He underwent CT A/P on 05/20/21 which was without evidence of mass, lymphadenopathy, or metastatic disease.  The patient reviewed the biopsy results with his urologist and he has kindly been referred today for discussion of potential radiation treatment options.   PREVIOUS RADIATION THERAPY: No  PAST MEDICAL HISTORY:  Past Medical History:  Diagnosis Date   Biatrial enlargement    Moderate mitral regurgitation    Persistent atrial  fibrillation ([place])    Raynaud's disease       PAST SURGICAL HISTORY: Past Surgical History:  Procedure Laterality Date   CARDIOVERSION N/A 01/11/2020   Procedure: CARDIOVERSION;  Surgeon: Fay Records, MD;  Location[facility] ENDOSCOPY;  Service: Cardiovascular;  Laterality: N/A;   COLONOSCOPY     ROTATOR CUFF REPAIR Left 2004   TEE WITHOUT CARDIOVERSION N/A 01/09/2021   Procedure: TRANSESOPHAGEAL ECHOCARDIOGRAM (TEE);  Surgeon: Fay Records, MD;  Location: [facility] ENDOSCOPY;  Service: Cardiovascular;  Laterality: N/A;    FAMILY HISTORY:  Family History  Problem Relation Age of Onset   Heart disease Father 51       Died of MI   Colon cancer Neg Hx    Esophageal cancer Neg Hx    Stomach cancer Neg Hx    Rectal cancer Neg Hx     SOCIAL HISTORY:  Social History   Socioeconomic History   Marital status: Married    Spouse name: Not on file   Number of children: Not on file   Years of education: Not on file   Highest education level: Not on file  Occupational History   Not on file  Tobacco Use   Smoking status: Never   Smokeless tobacco: Never  Vaping Use   Vaping Use: Never used  Substance and Sexual Activity   Alcohol use: Yes    Comment: socially   Drug use: No   Sexual activity: Not on file  Other Topics Concern  Not on file  Social History Narrative   Married.  Two children.  Retired Pharmacist, community.  Lives in [place] Strain: Not on file  Food Insecurity: Not on file  Transportation Needs: Not on file  Physical Activity: Not on file  Stress: Not on file  Social Connections: Not on file  Intimate Partner Violence: Not on file    ALLERGIES: Patient has no known allergies.  MEDICATIONS:  Current Outpatient Medications  Medication Sig Dispense Refill   atorvastatin (LIPITOR) 20 MG tablet Take 20 mg by mouth daily. 1 Tablet Daily     rivaroxaban (XARELTO) 20 MG TABS tablet TAKE 1 TABLET(20 MG) BY MOUTH DAILY WITH  SUPPER 90 tablet 1   PFIZER-BIONT COVID-19 VAC-TRIS SUSP injection      Propylene Glycol (SYSTANE COMPLETE OP) Place 1 drop into both eyes daily as needed (Allergy eyes).     No current facility-administered medications for this encounter.    REVIEW OF SYSTEMS:  On review of systems, the patient reports that he is doing well overall. He denies any chest pain, shortness of breath, cough, fevers, chills, night sweats, unintended weight changes. He denies any bowel disturbances, and denies abdominal pain, nausea or vomiting. He denies any new musculoskeletal or joint aches or pains. His IPSS was 8, indicating mild urinary symptoms. His SHIM was 23, indicating he does not have erectile dysfunction. A complete review of systems is obtained and is otherwise negative.    PHYSICAL EXAM:  Wt Readings from Last 3 Encounters:  07/07/21 193 lb 2 oz (87.6 kg)  01/16/21 187 lb (84.8 kg)  01/09/21 181 lb (82.1 kg)   Temp Readings from Last 3 Encounters:  07/07/21 (!) 96.3 F (35.7 C) (Temporal)  01/09/21 97.6 F (36.4 C) (Oral)  01/30/20 (!) 97.3 F (36.3 C)   BP Readings from Last 3 Encounters:  07/07/21 126/80  01/16/21 120/78  01/09/21 110/87   Pulse Readings from Last 3 Encounters:  07/07/21 (!) 59  01/16/21 (!) 56  01/09/21 (!) 50   Pain Assessment Pain Score: 0-No pain/10  In general this is a well appearing Caucasian male in no acute distress. He's alert and oriented x4 and appropriate throughout the examination. Cardiopulmonary assessment is negative for acute distress, and he exhibits normal effort.     KPS = 100  100 - Normal; no complaints; no evidence of disease. 90   - Able to carry on normal activity; minor signs or symptoms of disease. 80   - Normal activity with effort; some signs or symptoms of disease. 49   - Cares for self; unable to carry on normal activity or to do active work. 60   - Requires occasional assistance, but is able to care for most of his personal  needs. 50   - Requires considerable assistance and frequent medical care. 48   - Disabled; requires special care and assistance. 1   - Severely disabled; hospital admission is indicated although death not imminent. 31   - Very sick; hospital admission necessary; active supportive treatment necessary. 10   - Moribund; fatal processes progressing rapidly. 0     - Dead  Karnofsky DA, Abelmann WH, Craver LS and Burchenal [facility] [phone]) The use of the nitrogen mustards in the palliative treatment of carcinoma: with particular reference to bronchogenic carcinoma Cancer 1 634-56  LABORATORY DATA:  Lab Results  Component Value Date   WBC 8.0 01/06/2021   HGB 14.1 01/06/2021  HCT 40.6 01/06/2021   MCV 88 01/06/2021   PLT 247 01/06/2021   Lab Results  Component Value Date   NA 141 01/06/2021   K 4.3 01/06/2021   CL 103 01/06/2021   CO2 25 01/06/2021   Lab Results  Component Value Date   ALT 23 12/05/2020   AST 26 12/05/2020   ALKPHOS 35 (L) 12/04/2015   BILITOT 0.8 12/05/2020     RADIOGRAPHY: No results found.    IMPRESSION/PLAN: 1. 70 y.o. gentleman with Stage T1c adenocarcinoma of the prostate with Gleason Score of 4+3, and PSA of 7.22. We discussed the patient's workup and outlined the nature of prostate cancer in this setting. The patient's T stage, Gleason's score, and PSA put him into the intermediate risk group. Accordingly, he is eligible for a variety of potential treatment options including brachytherapy, 5.5 weeks of external radiation, or prostatectomy. We discussed the available radiation techniques, and focused on the details and logistics of delivery. We discussed and outlined the risks, benefits, short and long-term effects associated with radiotherapy and compared and contrasted these with prostatectomy. We discussed the role of SpaceOAR gel in reducing the rectal toxicity associated with radiotherapy. He appears to have a good understanding of his disease and our treatment  recommendations which are of curative intent.  He was encouraged to ask questions that were answered to his stated satisfaction.  At the conclusion of our conversation, the patient is interested in moving forward with brachytherapy and use of SpaceOAR gel to reduce rectal toxicity from radiotherapy.  We will share our discussion with Dr. Diona Fanti and move forward with scheduling his CT [facility] planning appointment in the near future.  The patient met briefly with Romie Jumper in our office who will be working closely with him to coordinate OR scheduling and pre and post procedure appointments.  We will contact the pharmaceutical rep to ensure that [place] is available at the time of procedure.  We enjoyed meeting him today and look forward to continuing to participate in his care.  We personally spent 70 minutes in this encounter including chart review, reviewing radiological studies, meeting face-to-face with the patient, entering orders and completing documentation.    Nicholos Johns, PA-C    Tyler Pita, MD  [facility] Direct Dial: [phone]  Fax: [phone] [email or web address]  Skype  LinkedIn   This document serves as a record of services personally performed by Tyler Pita, MD and Freeman Caldron, PA-C. It was created on their behalf by Wilburn Mylar, a trained medical scribe. The creation of this record is based on the scribe's personal observations and the provider's statements to them. This document has been checked and approved by the attending provider.

## 2021-07-06 NOTE — Progress Notes (Signed)
GU Location of Tumor / Histology: Prostate Ca  If Prostate Cancer, Gleason Score is (4 + 3) and PSA is (7.22 as of 5/22)  Biopsies  Dr. Diona Fanti        Past/Anticipated interventions by urology, if any:   Past/Anticipated interventions by medical oncology, if any:   Weight changes, if any: No weight stable.  IPSS:  8 SHIM:  23  Bowel/Bladder complaints, if any:  no bowel or bladder issues.  Nausea/Vomiting, if any:  No  Pain issues, if any:  0/10  SAFETY ISSUES: Prior radiation?   No Pacemaker/ICD?  No Possible current pregnancy?  Male Is the patient on methotrexate? No  Current Complaints / other details:  Need more information on treatment options.

## 2021-07-07 ENCOUNTER — Other Ambulatory Visit: Payer: Self-pay

## 2021-07-07 ENCOUNTER — Ambulatory Visit
Admission: RE | Admit: 2021-07-07 | Discharge: 2021-07-07 | Disposition: A | Discharge status: Home / Self Care | Payer: PPO | Source: Ambulatory Visit | Attending: Radiation Oncology | Admitting: Radiation Oncology

## 2021-07-07 VITALS — BP 126/80 | HR 59 | Temp 96.3°F | Resp 18 | Ht 68.0 in | Wt 193.1 lb

## 2021-07-07 DIAGNOSIS — Z7901 Long term (current) use of anticoagulants: Secondary | ICD-10-CM | POA: Insufficient documentation

## 2021-07-07 DIAGNOSIS — I4891 Unspecified atrial fibrillation: Secondary | ICD-10-CM | POA: Insufficient documentation

## 2021-07-07 DIAGNOSIS — I7300 Raynaud's syndrome without gangrene: Secondary | ICD-10-CM | POA: Insufficient documentation

## 2021-07-07 DIAGNOSIS — C61 Malignant neoplasm of prostate: Secondary | ICD-10-CM | POA: Diagnosis not present

## 2021-07-07 DIAGNOSIS — Z79899 Other long term (current) drug therapy: Secondary | ICD-10-CM | POA: Diagnosis not present

## 2021-07-07 DIAGNOSIS — I340 Nonrheumatic mitral (valve) insufficiency: Secondary | ICD-10-CM | POA: Diagnosis not present

## 2021-07-07 NOTE — Progress Notes (Signed)
Introduced myself to patient and his wife as the prostate nurse navigator and discussed my role.  No barriers to care identified at this time.  He is here to discuss his radiation treatment options.  I gave him my business card and asked him to call me with questions or concerns.  Verbalized understanding.

## 2021-07-09 ENCOUNTER — Telehealth: Payer: Self-pay | Admitting: *Deleted

## 2021-07-09 NOTE — Telephone Encounter (Signed)
Called patient to inform of pre-seed appts. for 08-20-21 and his implant  for 09-18-21, spoke with patient and he is aware of these appts.

## 2021-07-15 ENCOUNTER — Other Ambulatory Visit: Payer: Self-pay | Admitting: Urology

## 2021-08-05 NOTE — Progress Notes (Signed)
Spoke with dr Silvio Clayman  mda and ok to use cardiac clearance note and  blood thinner stopping instructions from 04-27-2021 clearance note in epic if no new cardiac issues since then.

## 2021-08-19 ENCOUNTER — Telehealth: Payer: Self-pay

## 2021-08-19 ENCOUNTER — Telehealth: Payer: Self-pay | Admitting: *Deleted

## 2021-08-19 NOTE — Telephone Encounter (Signed)
CALLED PATIENT TO REMIND OF PRE-SEED APPTS. FOR 08-20-21- ARRIVAL TIME- 8:15 AM @ [facility], LVM FOR A RETURN CALL

## 2021-08-19 NOTE — Telephone Encounter (Signed)
Spoke w/ patient and identified w/ 2 identifiers, in regards to a reminder of his 08/20/21 appointments. Patient instructed to arrive 15 min early for check-in at 8:15am. Patient verbalized understanding. I gave my extension [phone] if patient needs to call.

## 2021-08-20 ENCOUNTER — Ambulatory Visit
Admission: RE | Admit: 2021-08-20 | Discharge: 2021-08-20 | Disposition: A | Discharge status: Home / Self Care | Payer: PPO | Source: Ambulatory Visit | Attending: Urology | Admitting: Urology

## 2021-08-20 ENCOUNTER — Other Ambulatory Visit: Payer: Self-pay

## 2021-08-20 ENCOUNTER — Ambulatory Visit
Admission: RE | Admit: 2021-08-20 | Discharge: 2021-08-20 | Disposition: A | Discharge status: Home / Self Care | Payer: PPO | Source: Ambulatory Visit | Attending: Radiation Oncology | Admitting: Radiation Oncology

## 2021-08-20 ENCOUNTER — Encounter (HOSPITAL_COMMUNITY)
Admission: RE | Admit: 2021-08-20 | Discharge: 2021-08-20 | Disposition: A | Discharge status: Home / Self Care | Payer: PPO | Source: Ambulatory Visit | Attending: Urology | Admitting: Urology

## 2021-08-20 ENCOUNTER — Encounter: Payer: Self-pay | Admitting: Urology

## 2021-08-20 DIAGNOSIS — C61 Malignant neoplasm of prostate: Secondary | ICD-10-CM

## 2021-08-20 DIAGNOSIS — Z01810 Encounter for preprocedural cardiovascular examination: Secondary | ICD-10-CM | POA: Insufficient documentation

## 2021-08-20 NOTE — Progress Notes (Signed)
Patient states doing well. No symptoms reported at this time. Meaningful use complete.  No urinary management medications at this time. Urology follow-up scheduled for Aril 2023 w/ [facility].  There were no vitals taken for this visit.

## 2021-08-20 NOTE — Progress Notes (Signed)
°  Radiation Oncology         [phone] ________________________________  Name: AXEL FRISK, [name] MRN: [id number]  Date: 08/20/2021  DOB: [birth year]  SIMULATION AND TREATMENT PLANNING NOTE PUBIC ARCH STUDY  NI:DPOEUM, Cresenciano Lick, MD  Franchot Gallo, MD  DIAGNOSIS:  70 y.o. gentleman with Stage T1c adenocarcinoma of the prostate with Gleason score of 4+3, and PSA of 7.22.  Oncology History  Malignant neoplasm of prostate ([place])  05/06/2021 Cancer Staging   Staging form: Prostate, AJCC 8th Edition - Clinical stage from 05/06/2021: Stage IIC (cT1c, cN0, cM0, PSA: 7.2, Grade Group: 3) - Signed by Freeman Caldron, PA-C on 07/07/2021 Histopathologic type: Adenocarcinoma, NOS Stage prefix: Initial diagnosis Prostate specific antigen (PSA) range: Less than 10 Gleason primary pattern: 4 Gleason secondary pattern: 3 Gleason score: 7 Histologic grading system: 5 grade system Number of biopsy cores examined: 12 Number of biopsy cores positive: 4 Location of positive needle core biopsies: Both sides    07/07/2021 Initial Diagnosis   Malignant neoplasm of prostate ([place])       ICD-10-CM   1. Malignant neoplasm of prostate ([place])  C61       COMPLEX SIMULATION:  The patient presented today for evaluation for possible prostate seed implant. He was brought to the radiation planning suite and placed supine on the CT couch. A 3-dimensional image study set was obtained in upload to the planning computer. There, on each axial slice, I contoured the prostate gland. Then, using three-dimensional radiation planning tools I reconstructed the prostate in view of the structures from the transperineal needle pathway to assess for possible pubic arch interference. In doing so, I did not appreciate any pubic arch interference. Also, the patient's prostate volume was estimated based on the drawn structure. The volume was 39.2 cc.  Given the pubic arch appearance and prostate volume, patient remains a good  candidate to proceed with prostate seed implant. Today, he freely provided informed written consent to proceed.    PLAN: The patient will undergo prostate seed implant.   ________________________________  Sheral Apley. Tammi Klippel, M.D.

## 2021-09-09 NOTE — Progress Notes (Signed)
°  Radiation Oncology         [phone] ________________________________  Name: Grant Griffin, Grant Griffin MRN: [id number]  Date: 09/09/2021  DOB: [birth year]       Prostate Seed Implant  CC:Elby Showers, MD  No ref. provider found  DIAGNOSIS:  71 y.o. gentleman with Stage T1c adenocarcinoma of the prostate with Gleason score of 4+3, and PSA of 7.22.  Oncology History  Malignant neoplasm of prostate ([place])  05/06/2021 Cancer Staging   Staging form: Prostate, AJCC 8th Edition - Clinical stage from 05/06/2021: Stage IIC (cT1c, cN0, cM0, PSA: 7.2, Grade Group: 3) - Signed by Freeman Caldron, PA-C on 07/07/2021 Histopathologic type: Adenocarcinoma, NOS Stage prefix: Initial diagnosis Prostate specific antigen (PSA) range: Less than 10 Gleason primary pattern: 4 Gleason secondary pattern: 3 Gleason score: 7 Histologic grading system: 5 grade system Number of biopsy cores examined: 12 Number of biopsy cores positive: 4 Location of positive needle core biopsies: Both sides    07/07/2021 Initial Diagnosis   Malignant neoplasm of prostate ([place])     No diagnosis found.  PROCEDURE: Insertion of radioactive I-125 seeds into the prostate gland.  RADIATION DOSE: 145 Gy, definitive therapy.  TECHNIQUE: [name], Grant Griffin was brought to the operating room with the urologist. He was placed in the dorsolithotomy position. He was catheterized and a rectal tube was inserted. The perineum was shaved, prepped and draped. The ultrasound probe was then introduced into the rectum to see the prostate gland.  TREATMENT DEVICE: A needle grid was attached to the ultrasound probe stand and anchor needles were placed.  3D PLANNING: The prostate was imaged in 3D using a sagittal sweep of the prostate probe. These images were transferred to the planning computer. There, the prostate, urethra and rectum were defined on each axial reconstructed image. Then, the software created an optimized 3D plan and a few seed positions  were adjusted. The quality of the plan was reviewed using [facility] information for the target and the following two organs at risk:  Urethra and Rectum.  Then the accepted plan was printed and handed off to the radiation therapist.  Under my supervision, the custom loading of the seeds and spacers was carried out and loaded into sealed vicryl sleeves.  These pre-loaded needles were then placed into the needle holder.Marland Kitchen  PROSTATE VOLUME STUDY:  Using transrectal ultrasound the volume of the prostate was verified to be 33 cc.  SPECIAL TREATMENT PROCEDURE/SUPERVISION AND HANDLING: The pre-loaded needles were then delivered under sagittal guidance. A total of 16 needles were used to deposit 59 seeds in the prostate gland. The individual seed activity was 0.448 mCi.  SpaceOAR:  Yes  COMPLEX SIMULATION: At the end of the procedure, an anterior radiograph of the pelvis was obtained to document seed positioning and count. Cystoscopy was performed to check the urethra and bladder.  MICRODOSIMETRY: At the end of the procedure, the patient was emitting 0.094 mR/hr at 1 meter. Accordingly, he was considered safe for hospital discharge.  PLAN: The patient will return to the radiation oncology clinic for post implant CT dosimetry in three weeks.   ________________________________  Sheral Apley Tammi Klippel, M.D.

## 2021-09-10 ENCOUNTER — Other Ambulatory Visit: Payer: Self-pay | Admitting: Cardiology

## 2021-09-10 NOTE — Telephone Encounter (Signed)
Prescription refill request for Xarelto received.  Indication:Afib Last office visit:5/22 Weight:87.6 kg Age:71 Scr:0.9 CrCl:91.93 ml/min  Prescription refilled

## 2021-09-11 NOTE — Progress Notes (Signed)
Spoke with pt by phone and pt aware per dr Tresa Moore instructions stop xarelto 3 days before surgery and stop all herbs/supplements 2 weeks before surgery

## 2021-09-16 ENCOUNTER — Encounter (HOSPITAL_BASED_OUTPATIENT_CLINIC_OR_DEPARTMENT_OTHER): Payer: Self-pay | Admitting: Urology

## 2021-09-16 ENCOUNTER — Other Ambulatory Visit: Payer: Self-pay

## 2021-09-16 NOTE — Progress Notes (Addendum)
ADDENDUM:  Routed abnormal lab PT/INR  to Dr Diona Fanti  in epic.  Also, called and spoke w/ Pam, OR scheduler, to inform him.  PT/ INR will need to be repeated dos, orders entered.   Spoke w/ via phone for pre-op interview--- pt Lab needs dos----   no            Lab results------ pt getting lab work done 09-18-2021, CBC/ CMP/ PT/ PTT;  current ekg in epic/ chart COVID test -----patient states asymptomatic no test needed Arrive at ------- 1100 on 09-21-2021 NPO after MN NO Solid Food.  Clear liquids from MN until--- 1000 Med rec completed Medications to take morning of surgery ----- none Diabetic medication ----- n/a Patient instructed no nail polish to be worn day of surgery Patient instructed to bring photo id and insurance card day of surgery Patient aware to have Driver (ride ) / caregiver for 24 hours after surgery -- wife, pam Patient Special Instructions ----- n/a Pre-Op special Istructions ----- pt has cardiac telephone clearance by Leslie PA on 04-27-2021 in epic/ chart Patient verbalized understanding of instructions that were given at this phone interview. Patient denies shortness of breath, chest pain, fever, cough at this phone interview.    Anesthesia Review: AFib persistent with controlled rate, takes xarelto  PCP: Dr Jerilynn Mages. [place] (lov 12-09-2020 epic) Cardiologist : Dr Warren Lacy (lov 01-16-2021 epi) Chest x-ray : no EKG : 08-20-2021 epic Echo : TEE 01-09-2021 epic Stress test: no Cardiac Cath :  no Activity level:  pt denies sob w/ any acitivity Sleep Study/ CPAP : no  Blood Thinner/ Instructions Maryjane Hurter Dose: Xarelto ASA / Instructions/ Last Dose :  no Pt stated was given instructions by cardiology to stop 3 days prior to surgery, stated last dose will be 09-17-2021

## 2021-09-18 ENCOUNTER — Telehealth: Payer: Self-pay | Admitting: *Deleted

## 2021-09-18 ENCOUNTER — Other Ambulatory Visit: Payer: Self-pay

## 2021-09-18 ENCOUNTER — Encounter (HOSPITAL_COMMUNITY)
Admission: RE | Admit: 2021-09-18 | Discharge: 2021-09-18 | Disposition: A | Discharge status: Home / Self Care | Payer: PPO | Source: Ambulatory Visit | Attending: Urology | Admitting: Urology

## 2021-09-18 VITALS — BP 139/89 | HR 66 | Temp 98.1°F | Resp 18 | Ht 69.0 in

## 2021-09-18 DIAGNOSIS — Z7901 Long term (current) use of anticoagulants: Secondary | ICD-10-CM | POA: Diagnosis not present

## 2021-09-18 DIAGNOSIS — I482 Chronic atrial fibrillation, unspecified: Secondary | ICD-10-CM | POA: Insufficient documentation

## 2021-09-18 DIAGNOSIS — Z01812 Encounter for preprocedural laboratory examination: Secondary | ICD-10-CM | POA: Insufficient documentation

## 2021-09-18 DIAGNOSIS — C61 Malignant neoplasm of prostate: Secondary | ICD-10-CM | POA: Diagnosis not present

## 2021-09-18 LAB — COMPREHENSIVE METABOLIC PANEL
ALT: 31 U/L (ref 0–44)
AST: 28 U/L (ref 15–41)
Albumin: 4.1 g/dL (ref 3.5–5.0)
Alkaline Phosphatase: 56 U/L (ref 38–126)
Anion gap: 8 (ref 5–15)
BUN: 22 mg/dL (ref 8–23)
CO2: 26 mmol/L (ref 22–32)
Calcium: 9 mg/dL (ref 8.9–10.3)
Chloride: 103 mmol/L (ref 98–111)
Creatinine, Ser: 0.53 mg/dL — ABNORMAL LOW (ref 0.61–1.24)
GFR, Estimated: >60 mL/min (ref >60)
Glucose, Bld: 108 mg/dL — ABNORMAL HIGH (ref 70–99)
Potassium: 4.8 mmol/L (ref 3.5–5.1)
Sodium: 137 mmol/L (ref 135–145)
Total Bilirubin: 0.9 mg/dL (ref 0.3–1.2)
Total Protein: 6.6 g/dL (ref 6.5–8.1)

## 2021-09-18 LAB — CBC
HCT: 42.1 % (ref 39.0–52.0)
Hemoglobin: 14 g/dL (ref 13.0–17.0)
MCH: 29.4 pg (ref 26.0–34.0)
MCHC: 33.3 g/dL (ref 30.0–36.0)
MCV: 88.3 fL (ref 80.0–100.0)
Platelets: 223 10*3/uL (ref 150–400)
RBC: 4.77 MIL/uL (ref 4.22–5.81)
RDW: 12.5 % (ref 11.5–15.5)
WBC: 3.7 10*3/uL — ABNORMAL LOW (ref 4.0–10.5)
nRBC: 0 % (ref 0.0–0.2)

## 2021-09-18 LAB — APTT: aPTT: 36 seconds (ref 24–36)

## 2021-09-18 LAB — PROTIME-INR
INR: 2 — ABNORMAL HIGH (ref 0.8–1.2)
Prothrombin Time: 22.4 seconds — ABNORMAL HIGH (ref 11.4–15.2)

## 2021-09-18 NOTE — Telephone Encounter (Signed)
CALLED PATIENT TO REMIND OF PROCEDURE FOR 07-12-22, LVM FOR A RETURN CALL 

## 2021-09-20 NOTE — H&P (Signed)
H&P  Chief Complaint: Prostate cancer  History of Present Illness: 71 yo male presents for I 102 brachytherapy /Space OAR for mgmt of unfavorable intermediate risk PCa.  Previous hx:  8.31.2022: TRUS/Bx. PSA 7.22, prostate volume 32 mL, PSAD 0.23.  4/12 cores positive:  2 cores ( Rt base medial and Rt mid lateral) revealed GG2 pattern in 90 and 50 % of cores  2 cores (Lt base lateral and Rt base lateral) revealed GG 3 pattern in 40 and 80 % of cores   CT A/P --1. Mild prostatomegaly.  2. No evidence of mass, lymphadenopathy, or metastatic disease in  the abdomen or pelvis.  3. Mild thickening of the relatively decompressed urinary bladder  wall, likely due to chronic outlet obstruction.    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   Prostate cancer [facility])    urologist--- dr Diona Fanti ;  dx 11/ 2022   Raynaud's disease    RBBB (right bundle branch block)     Past Surgical History:  Procedure Laterality Date   CARDIOVERSION N/A 01/11/2020   Procedure: CARDIOVERSION;  Surgeon: Fay Records, MD;  Location: [place];  Service: Cardiovascular;  Laterality: N/A;   CATARACT EXTRACTION W/ INTRAOCULAR LENS IMPLANT Bilateral 07/2014   COLONOSCOPY     ROTATOR CUFF REPAIR Left 2004   TEE WITHOUT CARDIOVERSION N/A 01/09/2021   Procedure: TRANSESOPHAGEAL ECHOCARDIOGRAM (TEE);  Surgeon: Fay Records, MD;  Location: [facility] ENDOSCOPY;  Service: Cardiovascular;  Laterality: N/A;    Home Medications:  Allergies as of 09/20/2021   No Known Allergies      Medication List       Notice   Cannot display discharge medications because the patient has not yet been admitted.     Allergies: No Known Allergies  Family History  Problem Relation Age of Onset   Heart disease Father 62       Died of MI   Colon cancer Neg Hx    Esophageal cancer Neg Hx    Stomach cancer Neg Hx    Rectal cancer Neg Hx     Social History:  reports that he has never smoked. He has never used smokeless tobacco. He reports current alcohol use. He reports that he does not use drugs.  ROS: A complete review of systems was performed.  All systems are negative except for pertinent findings as noted.  Physical Exam:  Vital signs in last 24 hours: Ht 5\' 8"  (1.727 m)    Wt 87.6 kg    BMI 29.36 kg/m  Constitutional:  Alert and oriented, No acute distress Cardiovascular: Regular rate  Respiratory: Normal respiratory effort Lymphatic: No lymphadenopathy Neurologic: Grossly intact, no focal deficits Psychiatric: Normal mood and affect  I have reviewed prior pt notes  I have independently reviewed prior imaging  I have reviewed prior PSA/pathology results  I have reviewed prior urine culture   Impression/Assessment:  PCa  Plan:  I 125 brachytherapy, placement of SpaceOAR

## 2021-09-21 ENCOUNTER — Ambulatory Visit (HOSPITAL_COMMUNITY): Payer: PPO

## 2021-09-21 ENCOUNTER — Other Ambulatory Visit: Payer: Self-pay

## 2021-09-21 ENCOUNTER — Encounter (HOSPITAL_BASED_OUTPATIENT_CLINIC_OR_DEPARTMENT_OTHER): Payer: Self-pay | Admitting: Urology

## 2021-09-21 ENCOUNTER — Ambulatory Visit (HOSPITAL_BASED_OUTPATIENT_CLINIC_OR_DEPARTMENT_OTHER)
Admission: RE | Admit: 2021-09-21 | Discharge: 2021-09-21 | Disposition: A | Discharge status: Home / Self Care | Payer: PPO | Attending: Urology | Admitting: Urology

## 2021-09-21 ENCOUNTER — Ambulatory Visit (HOSPITAL_BASED_OUTPATIENT_CLINIC_OR_DEPARTMENT_OTHER): Payer: PPO | Admitting: Anesthesiology

## 2021-09-21 ENCOUNTER — Encounter (HOSPITAL_BASED_OUTPATIENT_CLINIC_OR_DEPARTMENT_OTHER)
Admission: RE | Disposition: A | Discharge status: Home / Self Care | Payer: Self-pay | Source: Home / Self Care | Attending: Urology

## 2021-09-21 DIAGNOSIS — I4891 Unspecified atrial fibrillation: Secondary | ICD-10-CM | POA: Diagnosis not present

## 2021-09-21 DIAGNOSIS — Z8616 Personal history of COVID-19: Secondary | ICD-10-CM | POA: Diagnosis not present

## 2021-09-21 DIAGNOSIS — C61 Malignant neoplasm of prostate: Secondary | ICD-10-CM | POA: Diagnosis not present

## 2021-09-21 DIAGNOSIS — Z7901 Long term (current) use of anticoagulants: Secondary | ICD-10-CM

## 2021-09-21 DIAGNOSIS — I482 Chronic atrial fibrillation, unspecified: Secondary | ICD-10-CM

## 2021-09-21 HISTORY — DX: Unspecified right bundle-branch block: I45.10

## 2021-09-21 HISTORY — DX: Personal history of COVID-19: Z86.16

## 2021-09-21 HISTORY — PX: RADIOACTIVE SEED IMPLANT: SHX5150

## 2021-09-21 HISTORY — DX: Malignant neoplasm of prostate: C61

## 2021-09-21 HISTORY — DX: Long term (current) use of anticoagulants: Z79.01

## 2021-09-21 HISTORY — PX: SPACE OAR INSTILLATION: SHX6769

## 2021-09-21 SURGERY — INSERTION, RADIATION SOURCE, PROSTATE
Anesthesia: General | Site: Rectum

## 2021-09-21 MED ORDER — CEFAZOLIN SODIUM-DEXTROSE 2-4 GM/100ML-% IV SOLN
2.0000 g | Freq: Once | INTRAVENOUS | Status: AC
  Administered 2021-09-21: 2 g via INTRAVENOUS

## 2021-09-21 MED ORDER — FLEET ENEMA 7-19 GM/118ML RE ENEM: 1.0000 | ENEMA | Freq: Once | RECTAL | Status: DC

## 2021-09-21 MED ORDER — ACETAMINOPHEN 500 MG PO TABS: 1000.0000 mg | ORAL_TABLET | Freq: Once | ORAL | Status: DC | PRN

## 2021-09-21 MED ORDER — LIDOCAINE 2% (20 MG/ML) 5 ML SYRINGE
INTRAMUSCULAR | Status: DC | PRN
  Administered 2021-09-21: 60 mg via INTRAVENOUS

## 2021-09-21 MED ORDER — FENTANYL CITRATE (PF) 100 MCG/2ML IJ SOLN
25.0000 ug | INTRAMUSCULAR | Status: DC | PRN
  Administered 2021-09-21: 50 ug via INTRAVENOUS

## 2021-09-21 MED ORDER — FENTANYL CITRATE (PF) 100 MCG/2ML IJ SOLN
INTRAMUSCULAR | Status: AC
  Filled 2021-09-21: qty 2

## 2021-09-21 MED ORDER — DEXAMETHASONE SODIUM PHOSPHATE 10 MG/ML IJ SOLN
INTRAMUSCULAR | Status: DC | PRN
Start: 2021-09-21 — End: 2021-09-21
  Administered 2021-09-21: 5 mg via INTRAVENOUS

## 2021-09-21 MED ORDER — PROPOFOL 10 MG/ML IV BOLUS
INTRAVENOUS | Status: AC
  Filled 2021-09-21: qty 20

## 2021-09-21 MED ORDER — OXYCODONE HCL 5 MG PO TABS: 5.0000 mg | ORAL_TABLET | Freq: Once | ORAL | Status: DC | PRN

## 2021-09-21 MED ORDER — LACTATED RINGERS IV SOLN: INTRAVENOUS | Status: DC

## 2021-09-21 MED ORDER — ACETAMINOPHEN 160 MG/5ML PO SOLN: 1000.0000 mg | Freq: Once | ORAL | Status: DC | PRN

## 2021-09-21 MED ORDER — PROPOFOL 10 MG/ML IV BOLUS
INTRAVENOUS | Status: DC | PRN
  Administered 2021-09-21: 200 mg via INTRAVENOUS

## 2021-09-21 MED ORDER — ACETAMINOPHEN 10 MG/ML IV SOLN: 1000.0000 mg | Freq: Once | INTRAVENOUS | Status: DC | PRN

## 2021-09-21 MED ORDER — FENTANYL CITRATE (PF) 100 MCG/2ML IJ SOLN: 25.0000 ug | INTRAMUSCULAR | Status: DC | PRN

## 2021-09-21 MED ORDER — IOHEXOL 300 MG/ML  SOLN
INTRAMUSCULAR | Status: DC | PRN
Start: 2021-09-21 — End: 2021-09-21
  Administered 2021-09-21: 7 mL

## 2021-09-21 MED ORDER — FENTANYL CITRATE (PF) 100 MCG/2ML IJ SOLN
INTRAMUSCULAR | Status: DC | PRN
  Administered 2021-09-21: 100 ug via INTRAVENOUS

## 2021-09-21 MED ORDER — ONDANSETRON HCL 4 MG/2ML IJ SOLN
INTRAMUSCULAR | Status: DC | PRN
  Administered 2021-09-21: 4 mg via INTRAVENOUS

## 2021-09-21 MED ORDER — DEXAMETHASONE SODIUM PHOSPHATE 10 MG/ML IJ SOLN
INTRAMUSCULAR | Status: AC
  Filled 2021-09-21: qty 1

## 2021-09-21 MED ORDER — ROCURONIUM BROMIDE 10 MG/ML (PF) SYRINGE
PREFILLED_SYRINGE | INTRAVENOUS | Status: AC
  Filled 2021-09-21: qty 10

## 2021-09-21 MED ORDER — ROCURONIUM BROMIDE 10 MG/ML (PF) SYRINGE
PREFILLED_SYRINGE | INTRAVENOUS | Status: DC | PRN
  Administered 2021-09-21: 60 mg via INTRAVENOUS

## 2021-09-21 MED ORDER — OXYCODONE HCL 5 MG/5ML PO SOLN
5.0000 mg | Freq: Once | ORAL | Status: DC | PRN
Start: 2021-09-21 — End: 2021-09-21

## 2021-09-21 MED ORDER — SUGAMMADEX SODIUM 200 MG/2ML IV SOLN
INTRAVENOUS | Status: DC | PRN
  Administered 2021-09-21: 170 mg via INTRAVENOUS

## 2021-09-21 MED ORDER — ONDANSETRON HCL 4 MG/2ML IJ SOLN
INTRAMUSCULAR | Status: AC
  Filled 2021-09-21: qty 2

## 2021-09-21 MED ORDER — CEFAZOLIN SODIUM-DEXTROSE 2-4 GM/100ML-% IV SOLN
INTRAVENOUS | Status: AC
  Filled 2021-09-21: qty 100

## 2021-09-21 SURGICAL SUPPLY — 42 items
BAG DRN RND TRDRP ANRFLXCHMBR (UROLOGICAL SUPPLIES) ×2
BAG URINE DRAIN 2000ML AR STRL (UROLOGICAL SUPPLIES) ×3 IMPLANT
BARD QUICKLINK CARTRIDGES WITH BRACHYSOURCE SEEDS ×59 IMPLANT
BLADE CLIPPER SENSICLIP SURGIC (BLADE) ×3 IMPLANT
CATH FOLEY 2WAY SLVR  5CC 16FR (CATHETERS) ×3
CATH FOLEY 2WAY SLVR 5CC 16FR (CATHETERS) ×2 IMPLANT
CATH ROBINSON RED A/P 16FR (CATHETERS) IMPLANT
CATH ROBINSON RED A/P 20FR (CATHETERS) ×3 IMPLANT
CLOTH BEACON ORANGE TIMEOUT ST (SAFETY) ×3 IMPLANT
COVER BACK TABLE 60X90IN (DRAPES) ×3 IMPLANT
COVER MAYO STAND STRL (DRAPES) ×3 IMPLANT
DRSG TEGADERM 4X4.75 (GAUZE/BANDAGES/DRESSINGS) ×5 IMPLANT
DRSG TEGADERM 8X12 (GAUZE/BANDAGES/DRESSINGS) ×6 IMPLANT
GAUZE SPONGE 4X4 12PLY STRL LF (GAUZE/BANDAGES/DRESSINGS) ×1 IMPLANT
GEL ULTRASOUND 20GR AQUASONIC (MISCELLANEOUS) ×3 IMPLANT
GLOVE SURG ENC MOIS LTX SZ6.5 (GLOVE) IMPLANT
GLOVE SURG ENC MOIS LTX SZ7.5 (GLOVE) IMPLANT
GLOVE SURG ENC MOIS LTX SZ8 (GLOVE) ×6 IMPLANT
GLOVE SURG ORTHO LTX SZ8.5 (GLOVE) ×3 IMPLANT
GLOVE SURG UNDER POLY LF SZ6.5 (GLOVE) IMPLANT
GOWN STRL REUS W/TWL XL LVL3 (GOWN DISPOSABLE) ×3 IMPLANT
GRID BRACH TEMP 18GA 2.8X3X.75 (MISCELLANEOUS) ×3 IMPLANT
HOLDER FOLEY CATH W/STRAP (MISCELLANEOUS) ×3 IMPLANT
IMPL SPACEOAR VUE SYSTEM (Spacer) ×2 IMPLANT
IMPLANT SPACEOAR VUE SYSTEM (Spacer) ×3 IMPLANT
IV NS 1000ML (IV SOLUTION) ×3
IV NS 1000ML BAXH (IV SOLUTION) ×2 IMPLANT
KIT TURNOVER CYSTO (KITS) ×3 IMPLANT
NDL BRACHY 18G 5PK (NEEDLE) ×8 IMPLANT
NDL BRACHY 18G SINGLE (NEEDLE) IMPLANT
NDL PK MORGANSTERN STABILIZ (NEEDLE) ×2 IMPLANT
NEEDLE BRACHY 18G 5PK (NEEDLE) IMPLANT
NEEDLE BRACHY 18G SINGLE (NEEDLE) IMPLANT
NEEDLE PK MORGANSTERN STABILIZ (NEEDLE) ×3 IMPLANT
PACK CYSTO (CUSTOM PROCEDURE TRAY) ×3 IMPLANT
SHEATH ULTRASOUND LF (SHEATH) IMPLANT
SHEATH ULTRASOUND LTX NONSTRL (SHEATH) ×1 IMPLANT
SUT BONE WAX W31G (SUTURE) IMPLANT
SYR 10ML LL (SYRINGE) ×3 IMPLANT
TOWEL OR 17X26 10 PK STRL BLUE (TOWEL DISPOSABLE) ×3 IMPLANT
UNDERPAD 30X36 HEAVY ABSORB (UNDERPADS AND DIAPERS) ×6 IMPLANT
WATER STERILE IRR 500ML POUR (IV SOLUTION) ×3 IMPLANT

## 2021-09-21 NOTE — Anesthesia Preprocedure Evaluation (Signed)
Anesthesia Evaluation  Patient identified by MRN, date of birth, ID band Patient awake    Reviewed: Allergy & Precautions, NPO status , Patient's Chart, lab work & pertinent test results  History of Anesthesia Complications Negative for: history of anesthetic complications  Airway Mallampati: III  TM Distance: >3 FB Neck ROM: Full  Mouth opening: Limited Mouth Opening  Dental  (+) Dental Advisory Given, Teeth Intact Upper bridge:   Pulmonary neg pulmonary ROS,    breath sounds clear to auscultation       Cardiovascular (-) hypertension(-) angina(-) Past MI and (-) CHF + dysrhythmias Atrial Fibrillation  Rhythm:Regular     Neuro/Psych neg Seizures sciatica  Neuromuscular disease    GI/Hepatic negative GI ROS, Neg liver ROS,   Endo/Other  negative endocrine ROS  Renal/GU negative Renal ROS     Musculoskeletal  (+) Arthritis ,   Abdominal   Peds  Hematology xarelto for afib  Lab Results      Component                Value               Date                      WBC                      3.7 (L)             09/18/2021                HGB                      14.0                09/18/2021                HCT                      42.1                09/18/2021                MCV                      88.3                09/18/2021                PLT                      223                 09/18/2021              Anesthesia Other Findings   Reproductive/Obstetrics                             Anesthesia Physical Anesthesia Plan  ASA: 2  Anesthesia Plan: General   Post-op Pain Management: Toradol IV (intra-op) and Tylenol PO (pre-op)   Induction: Intravenous  PONV Risk Score and Plan: 2 and Ondansetron and Dexamethasone  Airway Management Planned: Oral ETT and Video Laryngoscope Planned  Additional Equipment: None  Intra-op Plan:   Post-operative Plan: Extubation in OR  Informed  Consent: I have reviewed the patients History and Physical, chart, labs and discussed  the procedure including the risks, benefits and alternatives for the proposed anesthesia with the patient or authorized representative who has indicated his/her understanding and acceptance.     Dental advisory given  Plan Discussed with: CRNA and Anesthesiologist  Anesthesia Plan Comments:         Anesthesia Quick Evaluation

## 2021-09-21 NOTE — Op Note (Signed)
Preoperative diagnosis: Clinical stage TI C adenocarcinoma the prostate   Postoperative diagnosis: Same   Procedure: I-125 prostate seed implantation, SpaceOAR placement, flexible cystoscopy  Surgeon: Lillette Boxer. Stuart Mirabile M.D.  Radiation Oncologist: Tyler Pita, M.D.  Anesthesia: Gen.   Indications: Patient  was diagnosed with clinical stage TI C prostate cancer. We had extensive discussion with him about treatment options versus. He elected to proceed with seed implantation. He underwent consultation my office as well as with Dr. Tammi Klippel. He appeared to understand the advantages disadvantages potential risks of this treatment option. Full informed consent has been obtained.   Technique and findings: Patient was brought the operating room where he had successful induction of general anesthesia. He was placed in dorso-lithotomy position and prepped and draped in usual manner. Appropriate surgical timeout was performed. Radiation oncology department placed a transrectal ultrasound probe anchoring stand. Foley catheter with contrast in the balloon was inserted without difficulty. Anchoring needles were placed within the prostate. Rectal tube was placed. Real-time contouring of the urethra prostate and rectum were performed and the dosing parameters were established. Targeted dose was 145 gray.  I was then called  to the operating suite suite for placement of the needles. A second timeout was performed. All needle passage was done with real-time transrectal ultrasound guidance with the sagittal plane. A total of 16 needles were placed.  59 active seeds were implanted.  I then proceeded with placement of SpaceOAR by introducing a needle with the bevel angled inferiorly approximately 2 cm superior to the anus. This was angled downward and under direct ultrasound was placed within the space between the prostatic capsule and rectum. This was confirmed with a small amount of sterile saline injected and  this was performed under direct ultrasound. I then attached the SpaceOAR to the needle and injected this in the space between the prostate and rectum with good placement noted. The Foley catheter was removed and flexible cystoscopy failed to show any seeds outside the prostate.  The patient was brought to recovery room in stable condition, having tolerated the procedure well.Marland Kitchen

## 2021-09-21 NOTE — Anesthesia Procedure Notes (Signed)
Procedure Name: Intubation Date/Time: 09/21/2021 1:36 PM Performed by: Bonney Aid, CRNA Pre-anesthesia Checklist: Patient identified, Emergency Drugs available, Suction available and Patient being monitored Patient Re-evaluated:Patient Re-evaluated prior to induction Oxygen Delivery Method: Circle system utilized Preoxygenation: Pre-oxygenation with 100% oxygen Induction Type: IV induction Ventilation: Mask ventilation without difficulty Laryngoscope Size: Glidescope and 4 Grade View: Grade I Tube type: Oral Tube size: 7.5 mm Number of attempts: 1 Airway Equipment and Method: Stylet Placement Confirmation: ETT inserted through vocal cords under direct vision, positive ETCO2 and breath sounds checked- equal and bilateral Secured at: 23 cm Tube secured with: Tape Dental Injury: Teeth and Oropharynx as per pre-operative assessment

## 2021-09-21 NOTE — Transfer of Care (Signed)
Immediate Anesthesia Transfer of Care Note  Patient: Grant Griffin, [name]  Procedure(s) Performed: RADIOACTIVE SEED IMPLANT/BRACHYTHERAPY IMPLANT (Prostate) SPACE OAR INSTILLATION (Rectum)  Patient Location: PACU  Anesthesia Type:General  Level of Consciousness: awake, alert  and oriented  Airway & Oxygen Therapy: Patient Spontanous Breathing and Patient connected to nasal cannula oxygen  Post-op Assessment: Report given to RN  Post vital signs: Reviewed and stable  Last Vitals:  Vitals Value Taken Time  BP 143/90   Temp    Pulse 74 09/21/21 1448  Resp 17 09/21/21 1448  SpO2 97 % 09/21/21 1448  Vitals shown include unvalidated device data.  Last Pain:  Vitals:   09/21/21 1123  TempSrc: Oral  PainSc:          Complications: No notable events documented.

## 2021-09-21 NOTE — Interval H&P Note (Signed)
History and Physical Interval Note:  09/21/2021 12:20 PM  Grant Griffin, [name]  has presented today for surgery, with the diagnosis of PROSTATE CANCER.  The various methods of treatment have been discussed with the patient and family. After consideration of risks, benefits and other options for treatment, the patient has consented to  Procedure(s) with comments: RADIOACTIVE SEED IMPLANT/BRACHYTHERAPY IMPLANT (N/A) - 90 MINS SPACE OAR INSTILLATION (N/A) as a surgical intervention.  The patient's history has been reviewed, patient examined, no change in status, stable for surgery.  I have reviewed the patient's chart and labs.  Questions were answered to the patient's satisfaction.     Lillette Boxer Adaya Garmany

## 2021-09-21 NOTE — Discharge Instructions (Addendum)
Radioactive Seed Implant Home Care Instructions   Activity:    Rest for the remainder of the day.  Do not drive or operate equipment today.  You may resume normal  activities in a few days as instructed by your physician, without risk of harmful radiation exposure to those around you, provided you follow the time and distance precautions on the Radiation Oncology Instruction Sheet.   Meals: Drink plenty of lipuids and eat light foods, such as gelatin or soup this evening .  You may return to normal meal plan tomorrow.  Return To Work: You may return to work as instructed by Naval architect.  Special Instruction:   If any seeds are found, use tweezers to pick up seeds and place in a glass container of any kind and bring to your physician's office.  Call your physician if any of these symptoms occur:  Persistent or heavy bleeding Urine stream diminishes or stops completely after catheter is removed Fever equal to or greater than 101 degrees F Cloudy urine with a strong foul odor Severe pain  You may feel some burning pain and/or hesitancy when you urinate after the catheter is removed.  These symptoms may increase over the next few weeks, but should diminish within forur to six weeks.  Applying moist heat to the lower abdomen or a hot tub bath may help relieve the pain.  If the discomfort becomes severe, please call your physician for additional medications.      Post Anesthesia Home Care Instructions  Activity: Get plenty of rest for the remainder of the day. A responsible individual must stay with you for 24 hours following the procedure.  For the next 24 hours, DO NOT: -Drive a car -Paediatric nurse -Drink alcoholic beverages -Take any medication unless instructed by your physician -Make any legal decisions or sign important papers.  Meals: Start with liquid foods such as gelatin or soup. Progress to regular foods as tolerated. Avoid greasy, spicy, heavy foods. If nausea  and/or vomiting occur, drink only clear liquids until the nausea and/or vomiting subsides. Call your physician if vomiting continues.  Special Instructions/Symptoms: Your throat may feel dry or sore from the anesthesia or the breathing tube placed in your throat during surgery. If this causes discomfort, gargle with warm salt water. The discomfort should disappear within 24 hours.   Radioactive Seed Implant Home Care Instructions   Activity:    Rest for the remainder of the day.  Do not drive or operate equipment today.  You may resume normal  activities in a few days as instructed by your physician, without risk of harmful radiation exposure to those around you, provided you follow the time and distance precautions on the Radiation Oncology Instruction Sheet.   Meals: Drink plenty of lipuids and eat light foods, such as gelatin or soup this evening .  You may return to normal meal plan tomorrow.  Return To Work: You may return to work as instructed by Naval architect.  Special Instruction:   If any seeds are found, use tweezers to pick up seeds and place in a glass container of any kind and bring to your physician's office.  Call your physician if any of these symptoms occur:  Persistent or heavy bleeding Urine stream diminishes or stops completely after catheter is removed Fever equal to or greater than 101 degrees F Cloudy urine with a strong foul odor Severe pain  You may feel some burning pain and/or hesitancy when you urinate after the catheter is  removed.  These symptoms may increase over the next few weeks, but should diminish within forur to six weeks.  Applying moist heat to the lower abdomen or a hot tub bath may help relieve the pain.  If the discomfort becomes severe, please call your physician for additional medications.

## 2021-09-22 NOTE — Anesthesia Postprocedure Evaluation (Signed)
Anesthesia Post Note  Patient: [name], [name]  Procedure(s) Performed: RADIOACTIVE SEED IMPLANT/BRACHYTHERAPY IMPLANT (Prostate) SPACE OAR INSTILLATION (Rectum)     Patient location during evaluation: PACU Anesthesia Type: General Level of consciousness: awake and alert Pain management: pain level controlled Vital Signs Assessment: post-procedure vital signs reviewed and stable Respiratory status: spontaneous breathing, nonlabored ventilation, respiratory function stable and patient connected to nasal cannula oxygen Cardiovascular status: blood pressure returned to baseline and stable Postop Assessment: no apparent nausea or vomiting Anesthetic complications: no   No notable events documented.  Last Vitals:  Vitals:   09/21/21 1515 09/21/21 1540  BP: (!) 152/90 120/87  Pulse: 73 70  Resp: 16 16  Temp: 37 C   SpO2: 94% 98%    Last Pain:  Vitals:   09/21/21 1540  TempSrc:   PainSc: 1                  Olanda Downie

## 2021-09-23 ENCOUNTER — Encounter (HOSPITAL_BASED_OUTPATIENT_CLINIC_OR_DEPARTMENT_OTHER): Payer: Self-pay | Admitting: Urology

## 2021-10-14 ENCOUNTER — Telehealth: Payer: Self-pay | Admitting: *Deleted

## 2021-10-14 DIAGNOSIS — C61 Malignant neoplasm of prostate: Secondary | ICD-10-CM | POA: Diagnosis not present

## 2021-10-14 NOTE — Progress Notes (Signed)
°  Radiation Oncology         [phone] ________________________________  Name: Grant Griffin, [name] MRN: [id number]  Date: 10/15/2021  DOB: Sep 28, 1950  COMPLEX SIMULATION NOTE  NARRATIVE:  The patient was brought to the [facility] suite today following prostate seed implantation approximately one month ago.  Identity was confirmed.  All relevant records and images related to the planned course of therapy were reviewed.  Then, the patient was set-up supine.  CT images were obtained.  The CT images were loaded into the planning software.  Then the prostate and rectum were contoured.  Treatment planning then occurred.  The implanted iodine 125 seeds were identified by the physics staff for projection of radiation distribution  I have requested : 3D Simulation  I have requested a DVH of the following structures: Prostate and rectum.    ________________________________  Sheral Apley Tammi Klippel, M.D.

## 2021-10-14 NOTE — Telephone Encounter (Signed)
CALLED PATIENT TO REMIND OF POST SEED APPTS. FOR 10-15-21, LVM FOR A RETURN CALL

## 2021-10-15 ENCOUNTER — Encounter: Payer: Self-pay | Admitting: Urology

## 2021-10-15 ENCOUNTER — Ambulatory Visit
Admission: RE | Admit: 2021-10-15 | Discharge: 2021-10-15 | Disposition: A | Discharge status: Home / Self Care | Payer: PPO | Source: Ambulatory Visit | Attending: Urology | Admitting: Urology

## 2021-10-15 ENCOUNTER — Other Ambulatory Visit: Payer: Self-pay

## 2021-10-15 VITALS — BP 122/77 | HR 54 | Temp 97.5°F | Resp 20 | Ht 69.0 in | Wt 189.0 lb

## 2021-10-15 DIAGNOSIS — C61 Malignant neoplasm of prostate: Secondary | ICD-10-CM | POA: Insufficient documentation

## 2021-10-15 NOTE — Progress Notes (Signed)
Spoke w/ patient, verified identity, and begin nursing interview. Patient reports one episode of dysuria post removal of urinary catheter, but that discomfort has since dissipated and he's doing well. No issues to report at this time.  Meaningful use complete. I-PSS score of 8 (moderate). No urinary management medications at this time. Urology appointment- x3 months (May 2023)-per patient.  BP 122/77 (BP Location: Left Arm, Patient Position: Sitting, Cuff Size: Normal)    Pulse (!) 54    Temp (!) 97.5 F (36.4 C)    Resp 20    Ht 5\' 9"  (1.753 m)    Wt 189 lb (85.7 kg)    SpO2 99%    BMI 27.91 kg/m

## 2021-10-15 NOTE — Progress Notes (Signed)
Radiation Oncology         [phone] ________________________________  Name: Grant Griffin, [name] MRN: [id number]  Date: 10/15/2021  DOB: 06/02/1951  Post-Seed Follow-Up Visit Note  CC: Baxley, Cresenciano Lick, MD  Franchot Gallo, MD  Diagnosis:   71 y.o. gentleman with Stage T1c adenocarcinoma of the prostate with Gleason score of 4+3, and PSA of 7.22.    ICD-10-CM   1. Malignant neoplasm of prostate ([place])  C61       Interval Since Last Radiation:  3.5 weeks 09/21/21:  Insertion of radioactive I-125 seeds into the prostate gland; 145 Gy, definitive therapy with placement of SpaceOAR gel.  Narrative:  The patient returns today for routine follow-up.  He is complaining of increased urinary frequency and urinary hesitation symptoms. He filled out a questionnaire regarding urinary function today providing and overall IPSS score of 8 characterizing his symptoms as moderate but he reports that this is his baseline and he has not noticed any significant changes.  His pre-implant score was 8.  He specifically denies dysuria, gross hematuria, straining to void, excessive daytime frequency, urgency, incomplete bladder emptying or incontinence.  He denies any abdominal pain or bowel symptoms.  He reports a healthy appetite and is maintaining his weight.  He has not noticed any significant impact on his energy level and has remained active.  Overall, he is quite pleased with his progress to date.  ALLERGIES:  has No Known Allergies.  Meds: Current Outpatient Medications  Medication Sig Dispense Refill   atorvastatin (LIPITOR) 20 MG tablet Take 20 mg by mouth at bedtime. 1 Tablet Daily     Propylene Glycol (SYSTANE COMPLETE OP) Place 1 drop into both eyes daily as needed (Allergy eyes).     XARELTO 20 MG TABS tablet TAKE 1 TABLET(20 MG) BY MOUTH DAILY WITH SUPPER (Patient taking differently: 20 mg daily with supper.) 90 tablet 1   No current facility-administered medications for this encounter.     Physical Findings: Unable to assess due to telephone follow-up visit format.  Lab Findings: Lab Results  Component Value Date   WBC 3.7 (L) 09/18/2021   HGB 14.0 09/18/2021   HCT 42.1 09/18/2021   MCV 88.3 09/18/2021   PLT 223 09/18/2021    Radiographic Findings:  Patient underwent CT imaging in our clinic for post implant dosimetry. The CT will be reviewed by Dr. Tammi Klippel to confirm there is an adequate distribution of radioactive seeds throughout the prostate gland and ensure that there are no seeds in or near the rectum.  We suspect the final radiation plan and dosimetry will show appropriate coverage of the prostate gland. He understands that we will call and inform him of any unexpected findings on further review of his imaging and dosimetry.  Impression/Plan: 71 y.o. gentleman with Stage T1c adenocarcinoma of the prostate with Gleason score of 4+3, and PSA of 7.22. The patient is recovering from the effects of radiation. His urinary symptoms should gradually improve over the next 4-6 months. We talked about this today. He is encouraged by his improvement already and is otherwise pleased with his outcome. We also talked about long-term follow-up for prostate cancer following seed implant. He understands that ongoing PSA determinations and digital rectal exams will help perform surveillance to rule out disease recurrence. He had a follow up appointment with Daine Gravel, NP on 10/14/2021 and is scheduled for follow-up with Dr. Diona Fanti on 01/20/2022. He understands what to expect with his PSA measures. Patient was also educated  today about some of the long-term effects from radiation including a small risk for rectal bleeding and possibly erectile dysfunction. We talked about some of the general management approaches to these potential complications. However, I did encourage the patient to contact our office or return at any point if he has questions or concerns related to his previous  radiation and prostate cancer.    Nicholos Johns, PA-C

## 2021-10-23 ENCOUNTER — Encounter: Payer: Self-pay | Admitting: Radiation Oncology

## 2021-10-23 DIAGNOSIS — C61 Malignant neoplasm of prostate: Secondary | ICD-10-CM | POA: Diagnosis not present

## 2021-10-27 NOTE — Progress Notes (Signed)
°  Radiation Oncology         [phone] ________________________________  Name: Grant Griffin, [name] MRN: [id number]  Date: 10/23/2021  DOB: March 19, 1951  3D Planning Note   Prostate Brachytherapy Post-Implant Dosimetry  Diagnosis: 70 y.o. gentleman with Stage T1c adenocarcinoma of the prostate with Gleason score of 4+3, and PSA of 7.22.  Narrative: On a previous date, Grant Griffin, [name] returned following prostate seed implantation for post implant planning. He underwent CT scan complex simulation to delineate the three-dimensional structures of the pelvis and demonstrate the radiation distribution.  Since that time, the seed localization, and complex isodose planning with dose volume histograms have now been completed.  Results:   Prostate Coverage - The dose of radiation delivered to the 90% or more of the prostate gland (D90) was 106.89% of the prescription dose. This exceeds our goal of greater than 90%. Rectal Sparing - The volume of rectal tissue receiving the prescription dose or higher was 0.0 cc. This falls under our thresholds tolerance of 1.0 cc.  Impression: The prostate seed implant appears to show adequate target coverage and appropriate rectal sparing.  Plan:  The patient will continue to follow with urology for ongoing PSA determinations. I would anticipate a high likelihood for local tumor control with minimal risk for rectal morbidity.  ________________________________  Sheral Apley Tammi Klippel, M.D.

## 2021-11-11 ENCOUNTER — Encounter: Payer: Self-pay | Admitting: Internal Medicine

## 2021-11-11 DIAGNOSIS — C44619 Basal cell carcinoma of skin of left upper limb, including shoulder: Secondary | ICD-10-CM | POA: Diagnosis not present

## 2021-11-11 DIAGNOSIS — L821 Other seborrheic keratosis: Secondary | ICD-10-CM | POA: Diagnosis not present

## 2021-11-11 DIAGNOSIS — L281 Prurigo nodularis: Secondary | ICD-10-CM | POA: Diagnosis not present

## 2021-11-11 DIAGNOSIS — L570 Actinic keratosis: Secondary | ICD-10-CM | POA: Diagnosis not present

## 2021-11-11 DIAGNOSIS — L814 Other melanin hyperpigmentation: Secondary | ICD-10-CM | POA: Diagnosis not present

## 2021-11-11 DIAGNOSIS — Z85828 Personal history of other malignant neoplasm of skin: Secondary | ICD-10-CM | POA: Diagnosis not present

## 2021-11-17 ENCOUNTER — Telehealth: Payer: Self-pay | Admitting: *Deleted

## 2021-12-01 DIAGNOSIS — M25561 Pain in right knee: Secondary | ICD-10-CM | POA: Diagnosis not present

## 2021-12-01 DIAGNOSIS — M25661 Stiffness of right knee, not elsewhere classified: Secondary | ICD-10-CM | POA: Diagnosis not present

## 2021-12-01 DIAGNOSIS — M25551 Pain in right hip: Secondary | ICD-10-CM | POA: Diagnosis not present

## 2021-12-01 DIAGNOSIS — M25651 Stiffness of right hip, not elsewhere classified: Secondary | ICD-10-CM | POA: Diagnosis not present

## 2021-12-01 DIAGNOSIS — M25461 Effusion, right knee: Secondary | ICD-10-CM | POA: Diagnosis not present

## 2021-12-07 DIAGNOSIS — M25461 Effusion, right knee: Secondary | ICD-10-CM | POA: Diagnosis not present

## 2021-12-07 DIAGNOSIS — M25651 Stiffness of right hip, not elsewhere classified: Secondary | ICD-10-CM | POA: Diagnosis not present

## 2021-12-07 DIAGNOSIS — M25551 Pain in right hip: Secondary | ICD-10-CM | POA: Diagnosis not present

## 2021-12-07 DIAGNOSIS — M25561 Pain in right knee: Secondary | ICD-10-CM | POA: Diagnosis not present

## 2021-12-07 DIAGNOSIS — M25661 Stiffness of right knee, not elsewhere classified: Secondary | ICD-10-CM | POA: Diagnosis not present

## 2021-12-08 ENCOUNTER — Other Ambulatory Visit: Payer: Self-pay | Admitting: Cardiology

## 2021-12-10 DIAGNOSIS — M25561 Pain in right knee: Secondary | ICD-10-CM | POA: Diagnosis not present

## 2021-12-10 DIAGNOSIS — M25661 Stiffness of right knee, not elsewhere classified: Secondary | ICD-10-CM | POA: Diagnosis not present

## 2021-12-10 DIAGNOSIS — M25651 Stiffness of right hip, not elsewhere classified: Secondary | ICD-10-CM | POA: Diagnosis not present

## 2021-12-10 DIAGNOSIS — M25461 Effusion, right knee: Secondary | ICD-10-CM | POA: Diagnosis not present

## 2021-12-10 DIAGNOSIS — M25551 Pain in right hip: Secondary | ICD-10-CM | POA: Diagnosis not present

## 2021-12-14 DIAGNOSIS — M25461 Effusion, right knee: Secondary | ICD-10-CM | POA: Diagnosis not present

## 2021-12-14 DIAGNOSIS — M25651 Stiffness of right hip, not elsewhere classified: Secondary | ICD-10-CM | POA: Diagnosis not present

## 2021-12-14 DIAGNOSIS — M25561 Pain in right knee: Secondary | ICD-10-CM | POA: Diagnosis not present

## 2021-12-14 DIAGNOSIS — M25551 Pain in right hip: Secondary | ICD-10-CM | POA: Diagnosis not present

## 2021-12-14 DIAGNOSIS — M25661 Stiffness of right knee, not elsewhere classified: Secondary | ICD-10-CM | POA: Diagnosis not present

## 2021-12-18 DIAGNOSIS — M25661 Stiffness of right knee, not elsewhere classified: Secondary | ICD-10-CM | POA: Diagnosis not present

## 2021-12-18 DIAGNOSIS — M25651 Stiffness of right hip, not elsewhere classified: Secondary | ICD-10-CM | POA: Diagnosis not present

## 2021-12-18 DIAGNOSIS — M25561 Pain in right knee: Secondary | ICD-10-CM | POA: Diagnosis not present

## 2021-12-18 DIAGNOSIS — M25551 Pain in right hip: Secondary | ICD-10-CM | POA: Diagnosis not present

## 2021-12-18 DIAGNOSIS — M25461 Effusion, right knee: Secondary | ICD-10-CM | POA: Diagnosis not present

## 2021-12-22 ENCOUNTER — Ambulatory Visit (HOSPITAL_COMMUNITY): Payer: PPO | Attending: Cardiology

## 2021-12-22 DIAGNOSIS — M25661 Stiffness of right knee, not elsewhere classified: Secondary | ICD-10-CM | POA: Diagnosis not present

## 2021-12-22 DIAGNOSIS — I340 Nonrheumatic mitral (valve) insufficiency: Secondary | ICD-10-CM | POA: Diagnosis not present

## 2021-12-22 DIAGNOSIS — M25551 Pain in right hip: Secondary | ICD-10-CM | POA: Diagnosis not present

## 2021-12-22 DIAGNOSIS — M25651 Stiffness of right hip, not elsewhere classified: Secondary | ICD-10-CM | POA: Diagnosis not present

## 2021-12-22 DIAGNOSIS — M25461 Effusion, right knee: Secondary | ICD-10-CM | POA: Diagnosis not present

## 2021-12-22 DIAGNOSIS — M25561 Pain in right knee: Secondary | ICD-10-CM | POA: Diagnosis not present

## 2021-12-22 LAB — ECHOCARDIOGRAM COMPLETE: S' Lateral: 3.6 cm

## 2021-12-22 MED ORDER — PERFLUTREN LIPID MICROSPHERE
1.0000 mL | INTRAVENOUS | Status: AC | PRN
  Administered 2021-12-22: 1 mL via INTRAVENOUS

## 2021-12-25 DIAGNOSIS — M25651 Stiffness of right hip, not elsewhere classified: Secondary | ICD-10-CM | POA: Diagnosis not present

## 2021-12-25 DIAGNOSIS — M25561 Pain in right knee: Secondary | ICD-10-CM | POA: Diagnosis not present

## 2021-12-25 DIAGNOSIS — M25551 Pain in right hip: Secondary | ICD-10-CM | POA: Diagnosis not present

## 2021-12-25 DIAGNOSIS — M25661 Stiffness of right knee, not elsewhere classified: Secondary | ICD-10-CM | POA: Diagnosis not present

## 2021-12-25 DIAGNOSIS — M25461 Effusion, right knee: Secondary | ICD-10-CM | POA: Diagnosis not present

## 2022-01-20 DIAGNOSIS — M25551 Pain in right hip: Secondary | ICD-10-CM | POA: Diagnosis not present

## 2022-01-20 DIAGNOSIS — C61 Malignant neoplasm of prostate: Secondary | ICD-10-CM | POA: Diagnosis not present

## 2022-01-20 DIAGNOSIS — R351 Nocturia: Secondary | ICD-10-CM | POA: Diagnosis not present

## 2022-01-20 DIAGNOSIS — M25651 Stiffness of right hip, not elsewhere classified: Secondary | ICD-10-CM | POA: Diagnosis not present

## 2022-01-20 DIAGNOSIS — M25561 Pain in right knee: Secondary | ICD-10-CM | POA: Diagnosis not present

## 2022-01-20 DIAGNOSIS — M25661 Stiffness of right knee, not elsewhere classified: Secondary | ICD-10-CM | POA: Diagnosis not present

## 2022-01-20 DIAGNOSIS — M25461 Effusion, right knee: Secondary | ICD-10-CM | POA: Diagnosis not present

## 2022-01-26 DIAGNOSIS — M25661 Stiffness of right knee, not elsewhere classified: Secondary | ICD-10-CM | POA: Diagnosis not present

## 2022-01-26 DIAGNOSIS — M25551 Pain in right hip: Secondary | ICD-10-CM | POA: Diagnosis not present

## 2022-01-26 DIAGNOSIS — M25461 Effusion, right knee: Secondary | ICD-10-CM | POA: Diagnosis not present

## 2022-01-26 DIAGNOSIS — M25561 Pain in right knee: Secondary | ICD-10-CM | POA: Diagnosis not present

## 2022-01-26 DIAGNOSIS — M25651 Stiffness of right hip, not elsewhere classified: Secondary | ICD-10-CM | POA: Diagnosis not present

## 2022-02-03 DIAGNOSIS — M25651 Stiffness of right hip, not elsewhere classified: Secondary | ICD-10-CM | POA: Diagnosis not present

## 2022-02-03 DIAGNOSIS — M25551 Pain in right hip: Secondary | ICD-10-CM | POA: Diagnosis not present

## 2022-02-03 DIAGNOSIS — M25561 Pain in right knee: Secondary | ICD-10-CM | POA: Diagnosis not present

## 2022-02-03 DIAGNOSIS — M25461 Effusion, right knee: Secondary | ICD-10-CM | POA: Diagnosis not present

## 2022-02-03 DIAGNOSIS — M25661 Stiffness of right knee, not elsewhere classified: Secondary | ICD-10-CM | POA: Diagnosis not present

## 2022-02-09 DIAGNOSIS — M25461 Effusion, right knee: Secondary | ICD-10-CM | POA: Diagnosis not present

## 2022-02-09 DIAGNOSIS — M25561 Pain in right knee: Secondary | ICD-10-CM | POA: Diagnosis not present

## 2022-02-09 DIAGNOSIS — M25651 Stiffness of right hip, not elsewhere classified: Secondary | ICD-10-CM | POA: Diagnosis not present

## 2022-02-09 DIAGNOSIS — M25661 Stiffness of right knee, not elsewhere classified: Secondary | ICD-10-CM | POA: Diagnosis not present

## 2022-02-09 DIAGNOSIS — M25551 Pain in right hip: Secondary | ICD-10-CM | POA: Diagnosis not present

## 2022-02-17 ENCOUNTER — Telehealth: Payer: Self-pay | Admitting: Internal Medicine

## 2022-02-17 NOTE — Telephone Encounter (Signed)
LVM to CB to reschedule CPE appointment

## 2022-02-17 NOTE — Telephone Encounter (Signed)
Called back

## 2022-02-17 NOTE — Telephone Encounter (Signed)
LVM to CB to reschedule appt.

## 2022-02-18 ENCOUNTER — Other Ambulatory Visit: Payer: PPO

## 2022-02-18 DIAGNOSIS — R7302 Impaired glucose tolerance (oral): Secondary | ICD-10-CM

## 2022-02-18 DIAGNOSIS — R972 Elevated prostate specific antigen [PSA]: Secondary | ICD-10-CM | POA: Diagnosis not present

## 2022-02-18 DIAGNOSIS — E7800 Pure hypercholesterolemia, unspecified: Secondary | ICD-10-CM

## 2022-02-18 DIAGNOSIS — I482 Chronic atrial fibrillation, unspecified: Secondary | ICD-10-CM

## 2022-02-18 DIAGNOSIS — Z125 Encounter for screening for malignant neoplasm of prostate: Secondary | ICD-10-CM | POA: Diagnosis not present

## 2022-02-19 LAB — PSA: PSA: 2.48 ng/mL (ref <=4.00)

## 2022-02-19 LAB — HEMOGLOBIN A1C
Hgb A1c MFr Bld: 5.9 % of total Hgb — ABNORMAL HIGH (ref <5.7)
Mean Plasma Glucose: 123 mg/dL
eAG (mmol/L): 6.8 mmol/L

## 2022-02-19 LAB — LIPID PANEL
Cholesterol: 157 mg/dL (ref <200)
HDL: 99 mg/dL (ref >=40)
LDL Cholesterol (Calc): 45 mg/dL (calc)
Non-HDL Cholesterol (Calc): 58 mg/dL (calc) (ref <130)
Total CHOL/HDL Ratio: 1.6 (calc) (ref <5.0)
Triglycerides: 45 mg/dL (ref <150)

## 2022-02-19 LAB — COMPLETE METABOLIC PANEL WITH GFR
AG Ratio: 1.6 (calc) (ref 1.0–2.5)
ALT: 17 U/L (ref 9–46)
AST: 23 U/L (ref 10–35)
Albumin: 4.2 g/dL (ref 3.6–5.1)
Alkaline phosphatase (APISO): 90 U/L (ref 35–144)
BUN: 20 mg/dL (ref 7–25)
CO2: 28 mmol/L (ref 20–32)
Calcium: 9.3 mg/dL (ref 8.6–10.3)
Chloride: 104 mmol/L (ref 98–110)
Creat: 0.79 mg/dL (ref 0.70–1.28)
Globulin: 2.7 g/dL (calc) (ref 1.9–3.7)
Glucose, Bld: 80 mg/dL (ref 65–99)
Potassium: 5.3 mmol/L (ref 3.5–5.3)
Sodium: 142 mmol/L (ref 135–146)
Total Bilirubin: 0.6 mg/dL (ref 0.2–1.2)
Total Protein: 6.9 g/dL (ref 6.1–8.1)
eGFR: 94 mL/min/{1.73_m2} (ref >=60)

## 2022-02-19 LAB — CBC WITH DIFFERENTIAL/PLATELET
Absolute Monocytes: 439 cells/uL (ref 200–950)
Basophils Absolute: 22 cells/uL (ref 0–200)
Basophils Relative: 0.5 %
Eosinophils Absolute: 189 cells/uL (ref 15–500)
Eosinophils Relative: 4.4 %
HCT: 41.5 % (ref 38.5–50.0)
Hemoglobin: 14.1 g/dL (ref 13.2–17.1)
Lymphs Abs: 1019 cells/uL (ref 850–3900)
MCH: 29.9 pg (ref 27.0–33.0)
MCHC: 34 g/dL (ref 32.0–36.0)
MCV: 87.9 fL (ref 80.0–100.0)
MPV: 10.4 fL (ref 7.5–12.5)
Monocytes Relative: 10.2 %
Neutro Abs: 2632 cells/uL (ref 1500–7800)
Neutrophils Relative %: 61.2 %
Platelets: 268 10*3/uL (ref 140–400)
RBC: 4.72 10*6/uL (ref 4.20–5.80)
RDW: 12.7 % (ref 11.0–15.0)
Total Lymphocyte: 23.7 %
WBC: 4.3 10*3/uL (ref 3.8–10.8)

## 2022-02-24 ENCOUNTER — Ambulatory Visit: Payer: PPO | Admitting: Internal Medicine

## 2022-02-25 ENCOUNTER — Other Ambulatory Visit: Payer: PPO

## 2022-02-26 ENCOUNTER — Ambulatory Visit: Payer: PPO | Admitting: Internal Medicine

## 2022-03-04 ENCOUNTER — Encounter: Payer: Self-pay | Admitting: Internal Medicine

## 2022-03-04 ENCOUNTER — Ambulatory Visit (INDEPENDENT_AMBULATORY_CARE_PROVIDER_SITE_OTHER): Payer: PPO | Admitting: Internal Medicine

## 2022-03-04 VITALS — BP 118/88 | HR 81 | Temp 97.7°F | Ht 67.25 in | Wt 188.0 lb

## 2022-03-04 DIAGNOSIS — Z7184 Encounter for health counseling related to travel: Secondary | ICD-10-CM | POA: Diagnosis not present

## 2022-03-04 DIAGNOSIS — I340 Nonrheumatic mitral (valve) insufficiency: Secondary | ICD-10-CM | POA: Diagnosis not present

## 2022-03-04 DIAGNOSIS — E7800 Pure hypercholesterolemia, unspecified: Secondary | ICD-10-CM | POA: Diagnosis not present

## 2022-03-04 DIAGNOSIS — Z0000 Encounter for general adult medical examination without abnormal findings: Secondary | ICD-10-CM | POA: Diagnosis not present

## 2022-03-04 DIAGNOSIS — I482 Chronic atrial fibrillation, unspecified: Secondary | ICD-10-CM

## 2022-03-04 DIAGNOSIS — Z23 Encounter for immunization: Secondary | ICD-10-CM

## 2022-03-04 DIAGNOSIS — R7302 Impaired glucose tolerance (oral): Secondary | ICD-10-CM | POA: Diagnosis not present

## 2022-03-04 DIAGNOSIS — Z7901 Long term (current) use of anticoagulants: Secondary | ICD-10-CM

## 2022-03-04 DIAGNOSIS — Z86010 Personal history of colonic polyps: Secondary | ICD-10-CM

## 2022-03-04 DIAGNOSIS — C61 Malignant neoplasm of prostate: Secondary | ICD-10-CM | POA: Diagnosis not present

## 2022-03-04 LAB — POCT URINALYSIS DIPSTICK
Bilirubin, UA: NEGATIVE
Blood, UA: NEGATIVE
Glucose, UA: NEGATIVE
Ketones, UA: NEGATIVE
Leukocytes, UA: NEGATIVE
Nitrite, UA: NEGATIVE
Protein, UA: NEGATIVE
Spec Grav, UA: 1.025 (ref 1.010–1.025)
Urobilinogen, UA: 0.2 E.U./dL
pH, UA: 6 (ref 5.0–8.0)

## 2022-03-04 MED ORDER — METFORMIN HCL 500 MG PO TABS: 500.0000 mg | ORAL_TABLET | Freq: Every day | ORAL | 3 refills | Status: DC

## 2022-03-04 MED ORDER — LEVOFLOXACIN 500 MG PO TABS: 500.0000 mg | ORAL_TABLET | Freq: Every day | ORAL | 0 refills | Status: DC

## 2022-03-04 NOTE — Progress Notes (Signed)
Annual Wellness Visit     Patient: Grant, Griffin, Male    DOB: 1950-10-12, 71 y.o.   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  HPI He also presents for health maintenance exam and evaluation of medical issues.  He is planning a trip to [place] and needs some Ambien for this upcoming trip.  History of impaired glucose tolerance.  Hemoglobin A1c stable at 5.9%.  Have recommended metformin 500 mg daily.  In 2021 his total cholesterol was 224 with an LDL cholesterol of 112.  He is on lipid-lowering medication per Dr. Percival Spanish.  Fasting lipid panel is excellent.  History of sciatica, allergic rhinitis and Raynaud's phenomenon.  History of colonoscopy in 2021 with 7-year follow-up recommended by Dr. Loletha Carrow.  1 polyp removed was adenomatous.  No known drug allergies.  He is being followed by Dr. 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  PSA level in May 2022 was 7.22.  In April his PSA level was 6.21.  In August 2021 his PSA level was 4.9.  In February 2021 his PSA level was 4.6 and in 2018 his level was 3.8..  He has seen Dr. Tammi Klippel for consultation in November 2022.  Diagnosed with stage T1c adenocarcinoma the prostate with Gleason score of 7.  Has been treated with I 125 brachytherapy.  Past medical history: Left rotator cuff surgery.  Had COVID-19 in 2021. History of basal cell carcinoma left shoulder seen by Dr. Elvera Lennox in March 2023. History of moderate mitral regurgitation followed by Dr. Percival Spanish.  History of elevated coronary calcium score of 110.  Also was noted to have a 25 to 49% proximal LAD stenosis.  He had cardioversion in May 2021 for atrial fib, reverted to sinus rhythm and then went back into atrial fib in late  May 2021.  History of right bundle branch block.  He is physically active and does not seem to have significant symptoms being in atrial fib.  Remains on chronic anticoagulation with Eliquis.  Social history: He has a son who is a Pharmacist, community in Network engineer here locally in [place].  He has a daughter who is a physical therapist and lives in [place].  He has grandchildren.  He has never smoked.  Enjoys Civil [employer] history.  Social alcohol consumption.  Family history: Father with history of ischemic heart disease.  Father had MI at age 81.  Mother died in her late 29s of a stroke.  1 brother in good health.  1 sister in good health.  1 sister overweight but generally healthy.   Social History   Social History Narrative   Married.  Two children.  Retired Pharmacist, community.  Lives in [place]    Patient Care Team: Grant Griffin, Cresenciano Lick, MD as PCP - General (Internal Medicine) Grant Breeding, MD as PCP - Cardiology (Cardiology) Grant Puller, RN as Oncology Nurse Navigator  Review of Systems   Objective    Vitals: Blood pressure 118/88 pulse 81 regular temperature 97.7 pulse oximetry 97% weight 188 pounds BMI 29.23  Physical Exam skin: Warm and dry.  No cervical adenopathy.  TMs clear.  Neck supple.  No carotid bruits.  Chest clear.  Cardiac exam irregular contractions.  1/6 to 2/6 systolic  murmur.  No lower extremity pitting edema.  Abdomen is soft nondistended without hepatosplenomegaly masses or tenderness.  Affect thought and judgment are normal.   Most recent functional status assessment:    03/04/2022    2:02 PM  In your present state of health, do you have any difficulty performing the following activities:  Hearing? 0  Vision? 0  Difficulty concentrating or making decisions? 0  Walking or climbing stairs? 0  Dressing or bathing? 0  Doing errands, shopping? 0  Preparing Food and eating ? N  Using the Toilet? N  In the past six months, have you accidently leaked urine? N  Do you have problems  with loss of bowel control? N  Managing your Medications? N  Managing your Finances? N  Housekeeping or managing your Housekeeping? N   Most recent fall risk assessment:    03/04/2022    2:02 PM  [place] in the past year? 0  Number falls in past yr: 0  Injury with Fall? 0  Risk for fall due to : No Fall Risks  Follow up Falls evaluation completed    Most recent depression screenings:    03/04/2022    2:02 PM 12/09/2020    2:14 PM  PHQ 2/9 Scores  PHQ - 2 Score 0 1   Most recent cognitive screening:    03/04/2022    2:03 PM  6CIT Screen  What Year? 0 points  What month? 0 points  What time? 0 points  Count back from 20 0 points  Months in reverse 0 points  Repeat phrase 0 points  Total Score 0 points       Assessment & Plan  History of prostate cancer being treated and followed by Dr. Diona Fanti and radiation oncology.  Brachytherapy in November 2022.  Mitral regurgitation  Chronic atrial fib treated with Eliquis  Statin medication for coronary artery disease with calcium score of 110  History of allergic rhinitis  History of Raynaud's phenomenon  History of sciatica  Multiple basal cell carcinomas of skin treated by [facility] dermatology  Plan: Pneumococcal 20 vaccine given today.  Ambien prescribed to take on trip to [place] for sleep.  He uses it sparingly.  Return in 1 year or as needed.            Annual wellness visit done today including the all of the following: Reviewed patient's Family Medical History Reviewed and updated list of patient's medical providers Assessment of cognitive impairment was done Assessed patient's functional ability Established a written schedule for health screening [place] Completed and Reviewed  Discussed health benefits of physical activity, and encouraged him to engage in regular exercise appropriate for his age and condition.         IElby Showers, MD, have reviewed all  documentation for this visit. The documentation on 04/05/22 for the exam, diagnosis, procedures, and orders are all accurate and complete.   Angus Seller, CMA

## 2022-03-19 ENCOUNTER — Other Ambulatory Visit: Payer: Self-pay | Admitting: Cardiology

## 2022-03-19 NOTE — Telephone Encounter (Signed)
Prescription refill request for Xarelto received.  Indication: afib  Last office visit: [place] 01/16/2021 Weight: 85.3 kg  Age: 71 yo  Scr: 0.79, 02/18/2022 CrCl: 102 ml/min   Pt is overdue to see cardiologist. Pt is due to see Dr. Percival Spanish 04/2022. Will send in refill so pt does not run out.

## 2022-04-05 MED ORDER — ZOLPIDEM TARTRATE 10 MG PO TABS: 10.0000 mg | ORAL_TABLET | Freq: Every evening | ORAL | 1 refills | Status: DC | PRN

## 2022-04-05 NOTE — Patient Instructions (Signed)
It was a pleasure to see you today.  Ambien prescribed for travel.  Pneumococcal 20 vaccine given today.  Continue treatment by Cardiology and close follow-up with Urology and Radiation Oncology.  Return in 1 year or as needed.

## 2022-04-15 ENCOUNTER — Ambulatory Visit: Payer: PPO | Admitting: Cardiology

## 2022-04-15 NOTE — Progress Notes (Signed)
Cardiology Office Note  Already Date:  04/16/2022   ID:  [name], Grant Griffin, DOB [birth year], MRN [id number]  PCP:  Elby Showers, MD  Cardiologist:   Minus Breeding, MD Referring:  Elby Showers, MD   Chief Complaint  Patient presents with   Fatigue      History of Present Illness: Grant Griffin, Grant Griffin is a 71 y.o. male who is referred by Elby Showers, MD for evaluation of atrial fib and an abnormal EKG.  At the last visit I set him up for a DCCV.  I sent him for an echo which showed moderate MR.   He had an elevated coronary calcium as well on CT with a score of 110 which was 45% for his age and gender.  This demonstrated 25 to 49% proximal LAD stenosis.  He had cardioversion on 01/11/2020 and was in sinus rhythm following this.  However, he reverted back to atrial fib.   He has MR and I followed him with a TEE.  It was mild to moderate.  This was on echo in April.    He says he had a little more breathlessness or fatigue when he does something like climb up a flight of stairs.  However, he still very physically active.  He is going to [place] and will do some hiking. The patient denies any new symptoms such as chest discomfort, neck or arm discomfort. There has been no new shortness of breath, PND or orthopnea. There have been no reported palpitations, presyncope or syncope.   Past Medical History:  Diagnosis Date   Anticoagulant long-term use    xarelto--- managed by cardiology   History of COVID-19    per pt positive summer 2020 w/ mild symptoms that resolved   Moderate mitral regurgitation    per TEE 01-09-2021 in epic moderate MR without stenosis , mild RAE, moderate LAE, ef 60-65%   Persistent atrial fibrillation ([place]) 12/2019   cardiologist--- dr Warren Lacy;  CCTA 02-27-2020 calcium score=122, mild nonobstructive CAD in prox LAD   Prostate cancer [facility])    urologist--- dr Diona Fanti ;  dx 11/ 2022   Raynaud's disease    RBBB (right bundle branch block)     Past Surgical  History:  Procedure Laterality Date   CARDIOVERSION N/A 01/11/2020   Procedure: CARDIOVERSION;  Surgeon: Fay Records, MD;  Location: [facility];  Service: Cardiovascular;  Laterality: N/A;   CATARACT EXTRACTION W/ INTRAOCULAR LENS IMPLANT Bilateral 07/2014   COLONOSCOPY     RADIOACTIVE SEED IMPLANT N/A 09/21/2021   Procedure: RADIOACTIVE SEED IMPLANT/BRACHYTHERAPY IMPLANT;  Surgeon: Franchot Gallo, MD;  Location: [facility];  Service: Urology;  Laterality: N/A;  90 MINS   ROTATOR CUFF REPAIR Left 2004   SPACE OAR INSTILLATION N/A 09/21/2021   Procedure: SPACE OAR INSTILLATION;  Surgeon: Franchot Gallo, MD;  Location[facility];  Service: Urology;  Laterality: N/A;   TEE WITHOUT CARDIOVERSION N/A 01/09/2021   Procedure: TRANSESOPHAGEAL ECHOCARDIOGRAM (TEE);  Surgeon: Fay Records, MD;  Location[facility] ENDOSCOPY;  Service: Cardiovascular;  Laterality: N/A;     Current Outpatient Medications  Medication Sig Dispense Refill   atorvastatin (LIPITOR) 20 MG tablet TAKE 1 TABLET(20 MG) BY MOUTH DAILY 90 tablet 1   rivaroxaban (XARELTO) 20 MG TABS tablet TAKE 1 TABLET(20 MG) BY MOUTH DAILY WITH SUPPER 90 tablet 0   levofloxacin (LEVAQUIN) 500 MG tablet Take 1 tablet (500 mg total) by mouth daily. (Patient not taking:  Reported on 04/16/2022) 10 tablet 0   zolpidem (AMBIEN) 10 MG tablet Take 1 tablet (10 mg total) by mouth at bedtime as needed for sleep. (Patient not taking: Reported on 04/16/2022) 15 tablet 1   No current facility-administered medications for this visit.    Allergies:   Patient has no known allergies.    ROS:  Please see the history of present illness.   Otherwise, review of systems are positive for none  .   All other systems are reviewed and negative.    PHYSICAL EXAM: VS:  BP 118/72   Pulse (!) 59   Ht '5\' 8"'$  (1.727 m)   Wt 188 lb 9.6 oz (85.5 kg)   SpO2 99%   BMI 28.68 kg/m  , BMI Body mass index is 28.68 kg/m. GENERAL:  Well  appearing NECK:  No jugular venous distention, waveform within normal limits, carotid upstroke brisk and symmetric, no bruits, no thyromegaly LUNGS:  Clear to auscultation bilaterally CHEST:  Unremarkable HEART:  PMI not displaced or sustained,S1 and S2 within normal limits, no S3, no clicks, no rubs, no murmurs, irregular ABD:  Flat, positive bowel sounds normal in frequency in pitch, no bruits, no rebound, no guarding, no midline pulsatile mass, no hepatomegaly, no splenomegaly EXT:  2 plus pulses throughout, no edema, no cyanosis no clubbing   EKG:  EKG is  ordered today. Atrial fibrillation, rate 59, left axis deviation, left anterior fascicular block, no acute ST-T wave changes.   Recent Labs: 02/18/2022: ALT 17; BUN 20; Creat 0.79; Hemoglobin 14.1; Platelets 268; Potassium 5.3; Sodium 142    Lipid Panel    Component Value Date/Time   CHOL 157 02/18/2022 1113   CHOL 159 05/21/2020 0825   TRIG 45 02/18/2022 1113   HDL 99 02/18/2022 1113   HDL 104 05/21/2020 0825   CHOLHDL 1.6 02/18/2022 1113   VLDL 9 12/04/2015 0911   LDLCALC 45 02/18/2022 1113   LDLDIRECT 101.2 09/03/2009 0000      Wt Readings from Last 3 Encounters:  04/16/22 188 lb 9.6 oz (85.5 kg)  03/04/22 188 lb (85.3 kg)  10/15/21 189 lb (85.7 kg)      Other studies Reviewed: Additional studies/ records that were reviewed today include: Echo Review of the above records demonstrates:  Please see elsewhere in the note.     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  He has a slow ventricular rate and really does not notice this. Grant Griffin, Grant Griffin has a CHA2DS2 - VASc score of 1 but we had discussed years ago that he would prefer to be on anticoagulation knowing the risk benefits.  Of note he did see EP couple years ago and they decided not to do ablation.  No change in therapy.  MR:   I will follow this with an echo in 1 year.  FATIGUE: He does have slightly decreased exercise tolerance and  some mildly increased fatigue.  He does not sleep through the night because he has been diagnosed with prostate cancer has to get up to go to the bathroom several times.  He had treatment of this.  I think there is a small chance this fatigue could be related to something like chronotropic incompetence as he has some conduction delay and he and I talked about this.  He will pay attention to his heart rate when he is exercising.  CAD:   He has nonobstructive coronary disease.  We will continue with aggressive  risk reduction.  He had no symptoms.  DYSLIPIDEMIA:   LDL was 45 with an HDL of 99.  No change in therapy.    Current medicines are reviewed at length with the patient today.  The patient does not have concerns regarding medicines.  The following changes have been made:    None  Labs/ tests ordered today include:   None Orders Placed This Encounter  Procedures   EKG 12-Lead   ECHOCARDIOGRAM COMPLETE     Disposition:   Follow up with me in one year.     Signed, Minus Breeding, MD  04/16/2022 9:56 AM    [facility]

## 2022-04-16 ENCOUNTER — Encounter: Payer: Self-pay | Admitting: Cardiology

## 2022-04-16 ENCOUNTER — Ambulatory Visit (INDEPENDENT_AMBULATORY_CARE_PROVIDER_SITE_OTHER): Payer: PPO | Admitting: Cardiology

## 2022-04-16 VITALS — BP 118/72 | HR 59 | Ht 68.0 in | Wt 188.6 lb

## 2022-04-16 DIAGNOSIS — I2510 Atherosclerotic heart disease of native coronary artery without angina pectoris: Secondary | ICD-10-CM

## 2022-04-16 DIAGNOSIS — M25461 Effusion, right knee: Secondary | ICD-10-CM | POA: Diagnosis not present

## 2022-04-16 DIAGNOSIS — E785 Hyperlipidemia, unspecified: Secondary | ICD-10-CM

## 2022-04-16 DIAGNOSIS — M25661 Stiffness of right knee, not elsewhere classified: Secondary | ICD-10-CM | POA: Diagnosis not present

## 2022-04-16 DIAGNOSIS — M25651 Stiffness of right hip, not elsewhere classified: Secondary | ICD-10-CM | POA: Diagnosis not present

## 2022-04-16 DIAGNOSIS — I4819 Other persistent atrial fibrillation: Secondary | ICD-10-CM

## 2022-04-16 DIAGNOSIS — I340 Nonrheumatic mitral (valve) insufficiency: Secondary | ICD-10-CM

## 2022-04-16 DIAGNOSIS — M25561 Pain in right knee: Secondary | ICD-10-CM | POA: Diagnosis not present

## 2022-04-16 DIAGNOSIS — M25551 Pain in right hip: Secondary | ICD-10-CM | POA: Diagnosis not present

## 2022-04-16 NOTE — Patient Instructions (Signed)
Medication Instructions:  Your Physician recommend you continue on your current medication as directed.    *If you need a refill on your cardiac medications before your next appointment, please call your pharmacy*   Lab Work: None ordered today   Testing/Procedures: Your physician has requested that you have an echocardiogram in April, 2024. Echocardiography is a painless test that uses sound waves to create images of your heart. It provides your doctor with information about the size and shape of your heart and how well your heart's chambers and valves are working. This procedure takes approximately one hour. There are no restrictions for this procedure. [place] [address]    Follow-Up: At [facility], you and your health needs are our priority.  As part of our continuing mission to provide you with exceptional heart care, we have created designated Provider Care Teams.  These Care Teams include your primary Cardiologist (physician) and Advanced Practice Providers (APPs -  Physician Assistants and Nurse Practitioners) who all work together to provide you with the care you need, when you need it.  We recommend signing up for the patient portal called "MyChart".  Sign up information is provided on this After Visit Summary.  MyChart is used to connect with patients for Virtual Visits (Telemedicine).  Patients are able to view lab/test results, encounter notes, upcoming appointments, etc.  Non-urgent messages can be sent to your provider as well.   To learn more about what you can do with MyChart, go to [email or web address].    Your next appointment:   1 year(s)  The format for your next appointment:   In Person  Provider:   Minus Breeding, MD {

## 2022-04-23 DIAGNOSIS — M25651 Stiffness of right hip, not elsewhere classified: Secondary | ICD-10-CM | POA: Diagnosis not present

## 2022-04-23 DIAGNOSIS — M25551 Pain in right hip: Secondary | ICD-10-CM | POA: Diagnosis not present

## 2022-04-23 DIAGNOSIS — M25461 Effusion, right knee: Secondary | ICD-10-CM | POA: Diagnosis not present

## 2022-04-23 DIAGNOSIS — M25561 Pain in right knee: Secondary | ICD-10-CM | POA: Diagnosis not present

## 2022-04-23 DIAGNOSIS — M25661 Stiffness of right knee, not elsewhere classified: Secondary | ICD-10-CM | POA: Diagnosis not present

## 2022-04-27 DIAGNOSIS — M25551 Pain in right hip: Secondary | ICD-10-CM | POA: Diagnosis not present

## 2022-04-27 DIAGNOSIS — M25661 Stiffness of right knee, not elsewhere classified: Secondary | ICD-10-CM | POA: Diagnosis not present

## 2022-04-27 DIAGNOSIS — M25461 Effusion, right knee: Secondary | ICD-10-CM | POA: Diagnosis not present

## 2022-04-27 DIAGNOSIS — M25561 Pain in right knee: Secondary | ICD-10-CM | POA: Diagnosis not present

## 2022-04-27 DIAGNOSIS — M25651 Stiffness of right hip, not elsewhere classified: Secondary | ICD-10-CM | POA: Diagnosis not present

## 2022-05-03 DIAGNOSIS — M25651 Stiffness of right hip, not elsewhere classified: Secondary | ICD-10-CM | POA: Diagnosis not present

## 2022-05-03 DIAGNOSIS — M25461 Effusion, right knee: Secondary | ICD-10-CM | POA: Diagnosis not present

## 2022-05-03 DIAGNOSIS — M25661 Stiffness of right knee, not elsewhere classified: Secondary | ICD-10-CM | POA: Diagnosis not present

## 2022-05-03 DIAGNOSIS — M25551 Pain in right hip: Secondary | ICD-10-CM | POA: Diagnosis not present

## 2022-05-03 DIAGNOSIS — M25561 Pain in right knee: Secondary | ICD-10-CM | POA: Diagnosis not present

## 2022-05-11 DIAGNOSIS — M25551 Pain in right hip: Secondary | ICD-10-CM | POA: Diagnosis not present

## 2022-05-11 DIAGNOSIS — M25651 Stiffness of right hip, not elsewhere classified: Secondary | ICD-10-CM | POA: Diagnosis not present

## 2022-05-11 DIAGNOSIS — M25661 Stiffness of right knee, not elsewhere classified: Secondary | ICD-10-CM | POA: Diagnosis not present

## 2022-05-11 DIAGNOSIS — M25461 Effusion, right knee: Secondary | ICD-10-CM | POA: Diagnosis not present

## 2022-05-11 DIAGNOSIS — M25561 Pain in right knee: Secondary | ICD-10-CM | POA: Diagnosis not present

## 2022-05-31 ENCOUNTER — Other Ambulatory Visit: Payer: Self-pay | Admitting: Cardiology

## 2022-06-07 DIAGNOSIS — H18513 Endothelial corneal dystrophy, bilateral: Secondary | ICD-10-CM | POA: Diagnosis not present

## 2022-06-07 DIAGNOSIS — H02834 Dermatochalasis of left upper eyelid: Secondary | ICD-10-CM | POA: Diagnosis not present

## 2022-06-07 DIAGNOSIS — H52203 Unspecified astigmatism, bilateral: Secondary | ICD-10-CM | POA: Diagnosis not present

## 2022-06-07 DIAGNOSIS — H02831 Dermatochalasis of right upper eyelid: Secondary | ICD-10-CM | POA: Diagnosis not present

## 2022-06-18 DIAGNOSIS — Z85828 Personal history of other malignant neoplasm of skin: Secondary | ICD-10-CM | POA: Diagnosis not present

## 2022-06-18 DIAGNOSIS — D1801 Hemangioma of skin and subcutaneous tissue: Secondary | ICD-10-CM | POA: Diagnosis not present

## 2022-06-18 DIAGNOSIS — L821 Other seborrheic keratosis: Secondary | ICD-10-CM | POA: Diagnosis not present

## 2022-06-18 DIAGNOSIS — L814 Other melanin hyperpigmentation: Secondary | ICD-10-CM | POA: Diagnosis not present

## 2022-06-18 DIAGNOSIS — D225 Melanocytic nevi of trunk: Secondary | ICD-10-CM | POA: Diagnosis not present

## 2022-06-18 DIAGNOSIS — L570 Actinic keratosis: Secondary | ICD-10-CM | POA: Diagnosis not present

## 2022-07-02 ENCOUNTER — Encounter: Payer: Self-pay | Admitting: Cardiology

## 2022-07-14 DIAGNOSIS — C61 Malignant neoplasm of prostate: Secondary | ICD-10-CM | POA: Diagnosis not present

## 2022-07-16 DIAGNOSIS — M25651 Stiffness of right hip, not elsewhere classified: Secondary | ICD-10-CM | POA: Diagnosis not present

## 2022-07-16 DIAGNOSIS — M25551 Pain in right hip: Secondary | ICD-10-CM | POA: Diagnosis not present

## 2022-07-16 DIAGNOSIS — M25561 Pain in right knee: Secondary | ICD-10-CM | POA: Diagnosis not present

## 2022-07-20 DIAGNOSIS — M25551 Pain in right hip: Secondary | ICD-10-CM | POA: Diagnosis not present

## 2022-07-20 DIAGNOSIS — M25651 Stiffness of right hip, not elsewhere classified: Secondary | ICD-10-CM | POA: Diagnosis not present

## 2022-07-20 DIAGNOSIS — M25561 Pain in right knee: Secondary | ICD-10-CM | POA: Diagnosis not present

## 2022-07-21 DIAGNOSIS — Z8546 Personal history of malignant neoplasm of prostate: Secondary | ICD-10-CM | POA: Diagnosis not present

## 2022-07-26 ENCOUNTER — Ambulatory Visit (INDEPENDENT_AMBULATORY_CARE_PROVIDER_SITE_OTHER): Payer: PPO | Admitting: Internal Medicine

## 2022-07-26 ENCOUNTER — Telehealth: Payer: Self-pay | Admitting: Internal Medicine

## 2022-07-26 ENCOUNTER — Ambulatory Visit (HOSPITAL_BASED_OUTPATIENT_CLINIC_OR_DEPARTMENT_OTHER)
Admission: RE | Admit: 2022-07-26 | Discharge: 2022-07-26 | Disposition: A | Discharge status: Home / Self Care | Payer: PPO | Source: Ambulatory Visit | Attending: Internal Medicine | Admitting: Internal Medicine

## 2022-07-26 ENCOUNTER — Encounter: Payer: Self-pay | Admitting: Internal Medicine

## 2022-07-26 VITALS — BP 120/76 | HR 60 | Temp 98.5°F

## 2022-07-26 DIAGNOSIS — I482 Chronic atrial fibrillation, unspecified: Secondary | ICD-10-CM | POA: Diagnosis not present

## 2022-07-26 DIAGNOSIS — I340 Nonrheumatic mitral (valve) insufficiency: Secondary | ICD-10-CM

## 2022-07-26 DIAGNOSIS — J22 Unspecified acute lower respiratory infection: Secondary | ICD-10-CM | POA: Diagnosis not present

## 2022-07-26 DIAGNOSIS — R058 Other specified cough: Secondary | ICD-10-CM

## 2022-07-26 DIAGNOSIS — R059 Cough, unspecified: Secondary | ICD-10-CM | POA: Diagnosis not present

## 2022-07-26 DIAGNOSIS — Z7901 Long term (current) use of anticoagulants: Secondary | ICD-10-CM

## 2022-07-26 MED ORDER — LEVOFLOXACIN 500 MG PO TABS: 500.0000 mg | ORAL_TABLET | Freq: Every day | ORAL | 0 refills | Status: AC

## 2022-07-26 MED ORDER — BENZONATATE 100 MG PO CAPS: 100.0000 mg | ORAL_CAPSULE | Freq: Three times a day (TID) | ORAL | 0 refills | Status: DC | PRN

## 2022-07-26 NOTE — Patient Instructions (Signed)
Patient appears to have acute acute lower respiratory infection.  He had chest x-ray showing no consolidation but slightly increased central markings.  No pleural effusion.  He has been treated with Levaquin 500 mg daily for 7 days and Tessalon Perles 100 mg up to 3 times daily as needed for cough.  He will monitor pulse oximetry.  Rest and stay well-hydrated.

## 2022-07-26 NOTE — Progress Notes (Signed)
   Subjective:    Patient ID: [name], [name], male    DOB: 1950-09-14, 71 y.o.   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  He is leaving tomorrow for the [place] area with his wife to visit wife's family.  He has had a persistent cough, chest congestion, rhinorrhea for some 2-1/2 weeks.  He tested negative for COVID a week ago.  His cough is productive but the sputum is not discolored.  Has remained active throughout the illness but is concerned that he is not getting better.  Denies fever or shaking chills.  He has a history of atrial fibrillation followed by Dr. Percival Spanish and treated with chronic anticoagulation with Xarelto.  History of mild to moderate mitral regurgitation.  Followed by Dr. Diona Fanti for history of prostate cancer and has been treated with I-125 brachytherapy.   Recent PSA 1.35  Had COVID-19 in 2021.  Review of Systems denies chest pain, shortness of breath     Objective:   Physical Exam He is afebrile.  Pulse is 60 blood pressure 120/76 pulse oximetry 96% Skin: Warm and dry.  No cervical adenopathy.  TMs clear.  Pharynx very slightly injected.  Neck supple.  Chest: He has bilateral coarse breath sounds but no fine rales.      Assessment & Plan:   Acute lower respiratory infection-rule out pneumonia  Plan: Since he is planning to travel tomorrow to [place], would like for him to have a chest x-ray this afternoon.  I am placing him on Levaquin 500 mg daily for 7 days and prescribing Tessalon Perles 100 mg to take 3 times daily as needed for cough.  No focal consolidation on chest x-ray.  Slightly increased central markings.  No pleural effusion.

## 2022-07-26 NOTE — Telephone Encounter (Signed)
Grant Griffin [phone]  [name] called to say he has had persistent cough, chest congestion, runny nose and white / yellow mucus for about 2 1/2 weeks and he is going out of town tomorrow and would like to be seen today. He tested negative for COVID a week ago. I scheduled him to come in at 4:30

## 2022-08-25 NOTE — Telephone Encounter (Signed)
[place] called to say he is still struggling with cough, congestion, runny nose, sneezing since he was here a month ago. I offered an appointment today, he could not come this morning. So I gave him an appointment for 11:30 tomorrow.

## 2022-08-26 ENCOUNTER — Encounter: Payer: Self-pay | Admitting: Internal Medicine

## 2022-08-26 ENCOUNTER — Ambulatory Visit (INDEPENDENT_AMBULATORY_CARE_PROVIDER_SITE_OTHER): Payer: PPO | Admitting: Internal Medicine

## 2022-08-26 VITALS — BP 108/66 | HR 76 | Temp 98.2°F

## 2022-08-26 DIAGNOSIS — R053 Chronic cough: Secondary | ICD-10-CM

## 2022-08-26 DIAGNOSIS — R7302 Impaired glucose tolerance (oral): Secondary | ICD-10-CM

## 2022-08-26 MED ORDER — METHYLPREDNISOLONE 4 MG PO TABS: ORAL_TABLET | ORAL | 0 refills | Status: DC

## 2022-08-26 MED ORDER — AZITHROMYCIN 250 MG PO TABS: ORAL_TABLET | ORAL | 0 refills | Status: AC

## 2022-08-26 NOTE — Progress Notes (Signed)
   Subjective:    Patient ID: Grant Griffin, [name], male    DOB: 1951/07/17, 71 y.o.   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  Chest x-ray was negative and he was treated with Tessalon Perles and a 7-day course of Levaquin.  Today, he is having cough and congestion and although improved from illness in November he still has some lingering upper respiratory congestion.  He has a history of glucose intolerance.  History of prostate cancer.  History of chronic atrial fibrillation  Has had pneumococcal 20 vaccine in June 2023.  Needs to get flu vaccine when he has improved from this illness.  Consider RSV and COVID vaccines.  Review of Systems no fever, shaking chills, nausea, vomiting     Objective:   Physical Exam Temperature 98.2 degrees, blood pressure 108/66, pulse 76 and regular, pulse oximetry 97%  TMs are clear.  Pharynx is clear.  Neck supple.  Chest is without rales or wheezing     Assessment & Plan:   Acted lower respiratory infection  Plan: Zithromax Z-PAK 2 tabs day 1 followed by 1 tab days 2 through 5.  Take Medrol 4 mg tablets and tapering course starting with 6 tablets day 1 and decreasing by 1 tablet daily i.e. 6-5-4-3-2-1 taper.  Call if not improving in a few days.

## 2022-09-07 ENCOUNTER — Encounter: Payer: Self-pay | Admitting: Cardiology

## 2022-09-14 ENCOUNTER — Other Ambulatory Visit: Payer: Self-pay | Admitting: Cardiology

## 2022-09-14 ENCOUNTER — Telehealth: Payer: Self-pay

## 2022-09-14 DIAGNOSIS — I4819 Other persistent atrial fibrillation: Secondary | ICD-10-CM

## 2022-09-14 NOTE — Telephone Encounter (Signed)
   Pre-operative Risk Assessment    Patient Name: Grant Griffin, [name]  DOB: [birth year] MRN: [id number]     Request for Surgical Clearance    Procedure:  Minimally invasive procedure at the dentist dental office, extraction of tooth #8  Date of Surgery:  Clearance 09/17/22                                 Surgeon:  Dr Cherrie Distance Group or Practice Name:  [facility] Phone number:  [phone] Fax number:  [phone]   Type of Clearance Requested:   - Medical pleas advise as to mediation adjustments to facilitate clotting time during and after procedure   Type of Anesthesia:   ARTICAINE    Additional requests/questions:  Please advise surgeon/provider what medications should be held. Please fax a copy of CARDIAC CLEARANCE to the surgeon's office.  Signed, Jeanmarie Plant Nevelyn Mellott  CCMA 09/14/2022, 2:52 PM

## 2022-09-14 NOTE — Telephone Encounter (Signed)
Xarelto '20mg'$  refill request received. Pt is 73 years old, weight-85.5kg, Crea-0.79 on 02/18/2022, last seen by Dr. Percival Spanish on 04/16/2022, Diagnosis-Afib, CrCl-100.71 mL/min; Dose is appropriate based on dosing criteria. Will send in refill to requested pharmacy.

## 2022-10-02 NOTE — Patient Instructions (Addendum)
Take Medrol 4 mg tablets in tapering course as directed starting with 6 tablets day 1 and day creasing by 1 tablet daily.  Take Zithromax Z-PAK 2 tabs day 1 followed by 1 tab days 2 through 5.  Stay well-hydrated and rest.  Call if not improving within a few days.

## 2022-12-06 ENCOUNTER — Other Ambulatory Visit (HOSPITAL_COMMUNITY): Payer: PPO

## 2022-12-20 DIAGNOSIS — L821 Other seborrheic keratosis: Secondary | ICD-10-CM | POA: Diagnosis not present

## 2022-12-20 DIAGNOSIS — L570 Actinic keratosis: Secondary | ICD-10-CM | POA: Diagnosis not present

## 2022-12-20 DIAGNOSIS — C44519 Basal cell carcinoma of skin of other part of trunk: Secondary | ICD-10-CM | POA: Diagnosis not present

## 2022-12-20 DIAGNOSIS — Z85828 Personal history of other malignant neoplasm of skin: Secondary | ICD-10-CM | POA: Diagnosis not present

## 2022-12-20 DIAGNOSIS — L814 Other melanin hyperpigmentation: Secondary | ICD-10-CM | POA: Diagnosis not present

## 2022-12-20 DIAGNOSIS — C44729 Squamous cell carcinoma of skin of left lower limb, including hip: Secondary | ICD-10-CM | POA: Diagnosis not present

## 2022-12-22 ENCOUNTER — Ambulatory Visit (HOSPITAL_COMMUNITY): Payer: PPO | Attending: Cardiology

## 2022-12-22 DIAGNOSIS — I340 Nonrheumatic mitral (valve) insufficiency: Secondary | ICD-10-CM | POA: Diagnosis not present

## 2022-12-22 LAB — ECHOCARDIOGRAM COMPLETE
MV M vel: 4.74 m/s
MV Peak grad: 89.9 mmHg
S' Lateral: 3 cm

## 2022-12-23 ENCOUNTER — Encounter: Payer: Self-pay | Admitting: Cardiology

## 2023-01-18 DIAGNOSIS — C61 Malignant neoplasm of prostate: Secondary | ICD-10-CM | POA: Diagnosis not present

## 2023-01-25 DIAGNOSIS — C61 Malignant neoplasm of prostate: Secondary | ICD-10-CM | POA: Diagnosis not present

## 2023-03-14 NOTE — Progress Notes (Addendum)
Annual Wellness Visit    Patient Care Team: Reshaun Briseno, Luanna Cole, MD as PCP - General (Internal Medicine) Rollene Rotunda, MD as PCP - Cardiology (Cardiology) Cherlyn Cushing, RN as Oncology Nurse Navigator Jannifer Hick, MD as Consulting Physician (Urology)  Visit Date: 03/21/23   Chief Complaint  Patient presents with   Medicare Wellness    Medicare wellness.  No concerns. Not fasting.     Subjective:   Patient: Grant Griffin, [name], Male    DOB: 1951-01-18, 72 y.o.   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  History of hyperlipidemia treated with atorvastatin 20 mg daily. Lipid panel normal.  History of impaired glucose tolerance.  HGBA1c at 6.1% on 03/17/23, up from 5.9% on 02/18/22.  Have recommended metformin 500 mg daily. His diet has slipped lately. He will work on healthy eating and exercise.   History of sciatica, allergic rhinitis and Raynaud's phenomenon.  History of colonoscopy in 2021 with 7-year follow-up recommended by Dr. Myrtie Neither.  1 polyp removed was adenomatous.  No known drug allergies.  He is being followed by Dr. 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  PSA level in May 2022 was 7.22.  In April his PSA level was 6.21.  In August 2021 his PSA level was 4.9.  In February 2021 his PSA level was 4.6 and in 2018 his level was 3.8. He has seen Dr. Kathrynn Running for consultation in November 2022.  Diagnosed with stage T1c adenocarcinoma the prostate with Gleason score of 7.  Has been treated with I 125 brachytherapy.   Past medical history: Left rotator cuff surgery.  Had COVID-19 in 2021.  History of basal cell carcinoma left shoulder seen by Dr. Doreen Beam in March 2023.  History of moderate mitral regurgitation followed by Dr. Antoine Poche who he sees every 6 months.  History of elevated coronary calcium score of 110.  Also  was noted to have a 25 to 49% proximal LAD stenosis.  He had cardioversion in May 2021 for atrial fib, reverted to sinus rhythm and then went back into atrial fib in late May 2021.  History of right bundle branch block.  He is physically active and does not seem to have significant symptoms being in atrial fib.  Remains on chronic anticoagulation with with rivaroxaban 20 mg daily with supper.   Glucose normal. Kidney, liver functions normal. Electrolytes normal. Blood proteins normal. PSA at 1.01. CBC normal.   Social history: He has a son who is a Education officer, community in Financial risk analyst here locally in [place].  He has a daughter who is a physical therapist and lives in [place].  He has grandchildren.  He has never smoked.  Enjoys [employer] history.  Social alcohol consumption.  Family history: Father with history of ischemic heart disease.  Father had MI at age 26.  Mother died in her late 42s of a stroke.  1 brother in good health.  1 sister in good health.  1 sister overweight but generally healthy.   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  Two children.  Retired Education officer, community.  Lives in [place]      Review of Systems  Constitutional:  Negative for chills, fever, malaise/fatigue and weight loss.  HENT:  Negative for hearing loss, sinus pain and sore throat.   Respiratory:  Negative for cough, hemoptysis and shortness of breath.   Cardiovascular:  Negative for chest pain, palpitations, leg swelling and PND.  Gastrointestinal:  Negative for abdominal pain, constipation, diarrhea, heartburn, nausea and vomiting.  Genitourinary:  Negative for dysuria, frequency and urgency.  Musculoskeletal:  Negative for back pain, myalgias and neck pain.  Skin:  Negative for itching and rash.  Neurological:  Negative for dizziness, tingling, seizures and headaches.  Endo/Heme/Allergies:  Negative for polydipsia.  Psychiatric/Behavioral:  Negative for depression. The patient is not nervous/anxious.       Objective:   Vitals: BP 124/70   Pulse 62   Temp 98.7 F (37.1 C) (Temporal)   Ht 5\' 7"  (1.702 m)   Wt 185 lb (83.9 kg)   SpO2 100%   BMI 28.98 kg/m   Physical Exam Vitals and nursing note reviewed.  Constitutional:      General: He is awake. He is not in acute distress.    Appearance: Normal appearance. He is not ill-appearing or toxic-appearing.  HENT:     Head: Normocephalic and atraumatic.     Right Ear: Tympanic membrane, ear canal and external ear normal.     Left Ear: Tympanic membrane, ear canal and external ear normal.     Mouth/Throat:     Pharynx: Oropharynx is clear.  Eyes:     Extraocular Movements: Extraocular movements intact.     Pupils: Pupils are equal, round, and reactive to light.  Neck:     Thyroid: No thyroid mass, thyromegaly or thyroid tenderness.     Vascular: No carotid bruit.  Cardiovascular:     Rate and Rhythm: Normal rate. No extrasystoles are present.    Pulses:          Dorsalis pedis pulses are 1+ on the right side and 1+ on the left side.     Heart sounds: Normal heart sounds. No murmur heard.    No friction rub. No gallop.     Comments:  Occasional irregular contraction.  Mostly regular rate/rhythm. Pulmonary:     Effort: Pulmonary effort is normal.     Breath sounds: Normal breath sounds. No decreased breath sounds, wheezing, rhonchi or rales.  Chest:     Chest wall: No mass.  Abdominal:     Palpations: Abdomen is soft.     Tenderness: There is no abdominal tenderness.     Hernia: No hernia is present.  Musculoskeletal:     Cervical back: Normal range of motion.     Right lower  leg: No edema.     Left lower leg: No edema.  Lymphadenopathy:     Cervical: No cervical adenopathy.     Upper Body:     Right upper body: No supraclavicular adenopathy.     Left upper body: No supraclavicular adenopathy.  Skin:    General: Skin is warm and dry.  Neurological:     General: No focal deficit present.     Mental Status: He is alert and oriented to person, place, and time. Mental status is at baseline.     Cranial Nerves: Cranial nerves 2-12 are intact.     Sensory: Sensation is intact.     Motor: Motor function is intact.     Coordination: Coordination is intact.     Gait: Gait is intact.     Deep Tendon Reflexes: Reflexes are normal and symmetric.  Psychiatric:        Attention and Perception: Attention normal.        Mood and Affect: Mood normal.        Speech: Speech normal.        Behavior: Behavior normal. Behavior is cooperative.        Thought Content: Thought content normal.        Cognition and Memory: Cognition and memory normal.        Judgment: Judgment normal.      Most recent functional status assessment:     No data to display         Most recent fall risk assessment:    03/21/2023   10:19 AM  Fall Risk   Falls in the past year? 0  Number falls in past yr: 0  Injury with Fall? 0  Risk for fall due to : No Fall Risks    Most recent depression screenings:    03/21/2023   10:17 AM 08/26/2022   11:33 AM  PHQ 2/9 Scores  PHQ - 2 Score 0 0   Most recent cognitive screening:     03/04/2022    2:03 PM  6CIT Screen  What Year? 0 points  What month? 0 points  What time? 0 points  Count back from 20 0 points  Months in reverse 0 points  Repeat phrase 0 points  Total Score 0 points     Results:   Studies obtained and personally reviewed by me:  History of colonoscopy in 2021 with 7-year follow-up recommended by Dr. Myrtie Neither.  1 polyp removed was adenomatous.  Labs:       Component Value Date/Time   NA 141 03/17/2023 0911   NA 141 01/06/2021 1452   K 5.0 03/17/2023 0911   CL 103 03/17/2023 0911   CO2 30 03/17/2023 0911   GLUCOSE 89 03/17/2023 0911   BUN 23 03/17/2023 0911   BUN 18 01/06/2021 1452   CREATININE 0.84 03/17/2023 0911   CALCIUM 9.7 03/17/2023 0911   PROT 6.9 03/17/2023 0911   ALBUMIN 4.1 09/18/2021 0926   AST 25 03/17/2023 0911   ALT 18 03/17/2023 0911   ALKPHOS 56 09/18/2021 0926   BILITOT 0.8 03/17/2023 0911   GFRNONAA >60 09/18/2021 0926   GFRNONAA 92 12/05/2020 0948   GFRAA 107 12/05/2020 0948     Lab Results  Component Value Date   WBC 4.1 03/17/2023   HGB 14.2 03/17/2023   HCT 41.5 03/17/2023   MCV 87.9 03/17/2023   PLT 224 03/17/2023    Lab Results  Component Value Date   CHOL 167 03/17/2023  HDL 101 03/17/2023   LDLCALC 54 03/17/2023   LDLDIRECT 101.2 09/03/2009   TRIG 42 03/17/2023   CHOLHDL 1.7 03/17/2023    Lab Results  Component Value Date   HGBA1C 6.1 (H) 03/17/2023     Lab Results  Component Value Date   TSH 1.790 12/14/2019     Lab Results  Component Value Date   PSA 1.01 03/17/2023   PSA 2.48 02/18/2022   PSA 6.21 (H) 12/05/2020    Assessment & Plan:   Hyperlipidemia: treated with atorvastatin 20 mg daily. Lipid panel normal.  Impaired glucose tolerance: HGBA1c at 6.1% on 03/17/23, up from 5.9% on 02/18/22.  Have recommended metformin 500 mg daily. His diet has slipped lately. He will work on healthy eating and exercise.   Adenocarcinoma of the prostate: followed by Dr. Retta Diones who he  sees yearly. Has been treated with I 125 brachytherapy. PSA at 1.01.  Moderate mitral regurgitation: followed by Dr. Antoine Poche who he sees every 6 months.   History of colonoscopy in 2021 with 7-year follow-up recommended by Dr. Myrtie Neither.  1 polyp removed was adenomatous.  Vaccine counseling: UTD on tetanus, pneumococcal 20, shingles vaccines. Discussed RSV vaccine. Will receive Covid-19 and flu boosters in the fall.  Return in 6 months for repeat blood work and office visit.     Annual wellness visit done today including the all of the following: Reviewed patient's Family Medical History Reviewed and updated list of patient's medical providers Assessment of cognitive impairment was done Assessed patient's functional ability Established a written schedule for health screening services Health Risk Assessent Completed and Reviewed  Discussed health benefits of physical activity, and encouraged him to engage in regular exercise appropriate for his age and condition.        I,Alexander Ruley,acting as a Neurosurgeon for Margaree Mackintosh, MD.,have documented all relevant documentation on the behalf of Margaree Mackintosh, MD,as directed by  Margaree Mackintosh, MD while in the presence of Margaree Mackintosh, MD.   ***

## 2023-03-17 ENCOUNTER — Other Ambulatory Visit: Payer: PPO

## 2023-03-17 ENCOUNTER — Other Ambulatory Visit: Payer: Self-pay | Admitting: Cardiology

## 2023-03-17 DIAGNOSIS — I482 Chronic atrial fibrillation, unspecified: Secondary | ICD-10-CM | POA: Diagnosis not present

## 2023-03-17 DIAGNOSIS — Z125 Encounter for screening for malignant neoplasm of prostate: Secondary | ICD-10-CM | POA: Diagnosis not present

## 2023-03-17 DIAGNOSIS — Z0000 Encounter for general adult medical examination without abnormal findings: Secondary | ICD-10-CM | POA: Diagnosis not present

## 2023-03-17 DIAGNOSIS — R7302 Impaired glucose tolerance (oral): Secondary | ICD-10-CM

## 2023-03-17 DIAGNOSIS — E785 Hyperlipidemia, unspecified: Secondary | ICD-10-CM | POA: Diagnosis not present

## 2023-03-17 DIAGNOSIS — I4819 Other persistent atrial fibrillation: Secondary | ICD-10-CM

## 2023-03-17 NOTE — Telephone Encounter (Signed)
Prescription refill request for Xarelto received.  Indication:Afib  Last office visit:04/16/22 (Hochrein)  Weight: 85.5kg Age: 72 Scr: 0.79 (02/18/22) - updated labs collected today, 03/17/23 at 0906. Results pending.  CrCl: 100.49ml/min  Appropriate dose. Refill sent.

## 2023-03-18 LAB — COMPLETE METABOLIC PANEL WITH GFR
AG Ratio: 1.7 (calc) (ref 1.0–2.5)
ALT: 18 U/L (ref 9–46)
AST: 25 U/L (ref 10–35)
Albumin: 4.3 g/dL (ref 3.6–5.1)
Alkaline phosphatase (APISO): 93 U/L (ref 35–144)
BUN: 23 mg/dL (ref 7–25)
CO2: 30 mmol/L (ref 20–32)
Calcium: 9.7 mg/dL (ref 8.6–10.3)
Chloride: 103 mmol/L (ref 98–110)
Creat: 0.84 mg/dL (ref 0.70–1.28)
Globulin: 2.6 g/dL (calc) (ref 1.9–3.7)
Glucose, Bld: 89 mg/dL (ref 65–99)
Potassium: 5 mmol/L (ref 3.5–5.3)
Sodium: 141 mmol/L (ref 135–146)
Total Bilirubin: 0.8 mg/dL (ref 0.2–1.2)
Total Protein: 6.9 g/dL (ref 6.1–8.1)
eGFR: 92 mL/min/{1.73_m2} (ref >=60)

## 2023-03-18 LAB — CBC WITH DIFFERENTIAL/PLATELET
Absolute Monocytes: 480 cells/uL (ref 200–950)
Basophils Absolute: 29 cells/uL (ref 0–200)
Basophils Relative: 0.7 %
Eosinophils Absolute: 160 cells/uL (ref 15–500)
Eosinophils Relative: 3.9 %
HCT: 41.5 % (ref 38.5–50.0)
Hemoglobin: 14.2 g/dL (ref 13.2–17.1)
Lymphs Abs: 1328 cells/uL (ref 850–3900)
MCH: 30.1 pg (ref 27.0–33.0)
MCHC: 34.2 g/dL (ref 32.0–36.0)
MCV: 87.9 fL (ref 80.0–100.0)
MPV: 10.5 fL (ref 7.5–12.5)
Monocytes Relative: 11.7 %
Neutro Abs: 2103 cells/uL (ref 1500–7800)
Neutrophils Relative %: 51.3 %
Platelets: 224 10*3/uL (ref 140–400)
RBC: 4.72 10*6/uL (ref 4.20–5.80)
RDW: 12.7 % (ref 11.0–15.0)
Total Lymphocyte: 32.4 %
WBC: 4.1 10*3/uL (ref 3.8–10.8)

## 2023-03-18 LAB — LIPID PANEL
Cholesterol: 167 mg/dL (ref <200)
HDL: 101 mg/dL (ref >=40)
LDL Cholesterol (Calc): 54 mg/dL (calc)
Non-HDL Cholesterol (Calc): 66 mg/dL (calc) (ref <130)
Total CHOL/HDL Ratio: 1.7 (calc) (ref <5.0)
Triglycerides: 42 mg/dL (ref <150)

## 2023-03-18 LAB — PSA: PSA: 1.01 ng/mL (ref <=4.00)

## 2023-03-18 LAB — HEMOGLOBIN A1C
Hgb A1c MFr Bld: 6.1 % of total Hgb — ABNORMAL HIGH (ref <5.7)
Mean Plasma Glucose: 128 mg/dL
eAG (mmol/L): 7.1 mmol/L

## 2023-03-21 ENCOUNTER — Encounter: Payer: Self-pay | Admitting: Internal Medicine

## 2023-03-21 ENCOUNTER — Ambulatory Visit (INDEPENDENT_AMBULATORY_CARE_PROVIDER_SITE_OTHER): Payer: PPO | Admitting: Internal Medicine

## 2023-03-21 VITALS — BP 124/70 | HR 62 | Temp 98.7°F | Ht 67.0 in | Wt 185.0 lb

## 2023-03-21 DIAGNOSIS — Z7901 Long term (current) use of anticoagulants: Secondary | ICD-10-CM | POA: Diagnosis not present

## 2023-03-21 DIAGNOSIS — Z0000 Encounter for general adult medical examination without abnormal findings: Secondary | ICD-10-CM | POA: Diagnosis not present

## 2023-03-21 DIAGNOSIS — I482 Chronic atrial fibrillation, unspecified: Secondary | ICD-10-CM | POA: Diagnosis not present

## 2023-03-21 DIAGNOSIS — I340 Nonrheumatic mitral (valve) insufficiency: Secondary | ICD-10-CM | POA: Diagnosis not present

## 2023-03-21 DIAGNOSIS — Z86010 Personal history of colonic polyps: Secondary | ICD-10-CM | POA: Diagnosis not present

## 2023-03-21 DIAGNOSIS — E7800 Pure hypercholesterolemia, unspecified: Secondary | ICD-10-CM | POA: Diagnosis not present

## 2023-03-21 DIAGNOSIS — C61 Malignant neoplasm of prostate: Secondary | ICD-10-CM

## 2023-03-21 DIAGNOSIS — Z85828 Personal history of other malignant neoplasm of skin: Secondary | ICD-10-CM | POA: Diagnosis not present

## 2023-03-21 LAB — POCT URINALYSIS DIP (CLINITEK)
Bilirubin, UA: NEGATIVE
Blood, UA: NEGATIVE
Glucose, UA: NEGATIVE mg/dL
Ketones, POC UA: NEGATIVE mg/dL
Leukocytes, UA: NEGATIVE
Nitrite, UA: NEGATIVE
POC PROTEIN,UA: NEGATIVE
Spec Grav, UA: 1.015 (ref 1.010–1.025)
Urobilinogen, UA: 0.2 E.U./dL
pH, UA: 5 (ref 5.0–8.0)

## 2023-04-03 NOTE — Patient Instructions (Addendum)
It was a pleasure to see you today.  Vaccines discussed.  Will receive flu and COVID-19 boosters in the Fall.  Discussed RSV vaccine since he has grandchildren.  Labs are stable.  Suggest metformin 500 mg daily.  Recommend rechecking hemoglobin A1c in about 6 months.  Continue diet and exercise regimen.  Continue atorvastatin 20 mg daily.  Lipid panel is normal.

## 2023-04-22 ENCOUNTER — Ambulatory Visit: Payer: PPO | Admitting: Cardiology

## 2023-05-16 DIAGNOSIS — M25511 Pain in right shoulder: Secondary | ICD-10-CM | POA: Diagnosis not present

## 2023-06-07 ENCOUNTER — Other Ambulatory Visit: Payer: Self-pay | Admitting: Cardiology

## 2023-06-21 DIAGNOSIS — H40013 Open angle with borderline findings, low risk, bilateral: Secondary | ICD-10-CM | POA: Diagnosis not present

## 2023-06-21 DIAGNOSIS — H5203 Hypermetropia, bilateral: Secondary | ICD-10-CM | POA: Diagnosis not present

## 2023-06-21 DIAGNOSIS — H10413 Chronic giant papillary conjunctivitis, bilateral: Secondary | ICD-10-CM | POA: Diagnosis not present

## 2023-06-21 DIAGNOSIS — Z961 Presence of intraocular lens: Secondary | ICD-10-CM | POA: Diagnosis not present

## 2023-06-29 DIAGNOSIS — M7541 Impingement syndrome of right shoulder: Secondary | ICD-10-CM | POA: Diagnosis not present

## 2023-06-30 ENCOUNTER — Ambulatory Visit: Payer: PPO | Admitting: Cardiology

## 2023-07-04 DIAGNOSIS — R5383 Other fatigue: Secondary | ICD-10-CM | POA: Insufficient documentation

## 2023-07-04 NOTE — Progress Notes (Deleted)
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  At the last visit I set him up for a DCCV.  I sent him for an echo which showed moderate MR.   He had an elevated coronary calcium as well on CT with a score of 110 which was 45% for his age and gender.  This demonstrated 25 to 49% proximal LAD stenosis.  He had cardioversion on 01/11/2020 and was in sinus rhythm following this.  However, he reverted back to atrial fib.   He has MR and I followed him with a TEE.  It was mild to moderate.  This was on echo in April.     He says he had a little more breathlessness or fatigue when he does something like climb up a flight of stairs.  However, he still very physically active.  He is going to [place] and will do some hiking. The patient denies any new symptoms such as chest discomfort, neck or arm discomfort. There has been no new shortness of breath, PND or orthopnea. There have been no reported palpitations, presyncope or syncope.  ROS: ***  Studies Reviewed:    EKG:       ***  Risk Assessment/Calculations:   {Does this patient have ATRIAL FIBRILLATION?[phone]} No BP recorded.  {Refresh Note OR Click here to enter BP  :1}***        Physical Exam:   VS:  There were no vitals taken for this visit.   Wt Readings from Last 3 Encounters:  03/21/23 185 lb (83.9 kg)  04/16/22 188 lb 9.6 oz (85.5 kg)  03/04/22 188 lb (85.3 kg)     GEN: Well nourished, well developed in no acute distress NECK: No JVD; No carotid bruits CARDIAC: ***RRR, no murmurs, rubs, gallops RESPIRATORY:  Clear to auscultation without rales, wheezing or rhonchi  ABDOMEN: Soft, non-tender, non-distended EXTREMITIES:  No edema;  No deformity   ASSESSMENT AND PLAN:   ATRIAL FIB:   ***  This is a new diagnosis.  He has a slow ventricular rate and really does not notice this. Grant Griffin, [name] has a CHA2DS2 - VASc score of 1 but we had discussed years ago that he would prefer to be on anticoagulation knowing the risk benefits.  Of note he did see EP couple years ago and they decided not to do ablation.  No change in therapy.   MR:   This was mild on echo in April 2024.  ***  I will follow this with an echo in 1 year.   FATIGUE:   *** He does have slightly decreased exercise tolerance and some mildly increased fatigue.  He does not sleep through the night because he has been diagnosed with prostate cancer has to get up to go to the bathroom several times.  He had treatment of this.  I think there is a small chance this fatigue could be related to something like chronotropic incompetence as he has some conduction delay and he and I talked about this.  He will pay attention to his heart rate when he is exercising.   CAD:   He has nonobstructive coronary  disease.  ***  We will continue with aggressive risk reduction.  He had no symptoms.   DYSLIPIDEMIA:   LDL was ***  45 with an HDL of 99.  No change in therapy.     {Are you ordering a CV Procedure (e.g. stress test, cath, DCCV, TEE, etc)?   Press F2        :[id number]}  Follow up ***  Signed, Rollene Rotunda, MD

## 2023-07-06 DIAGNOSIS — M7541 Impingement syndrome of right shoulder: Secondary | ICD-10-CM | POA: Diagnosis not present

## 2023-07-08 ENCOUNTER — Ambulatory Visit: Payer: PPO | Attending: Cardiology | Admitting: Cardiology

## 2023-07-08 ENCOUNTER — Telehealth: Payer: Self-pay

## 2023-07-08 DIAGNOSIS — R5383 Other fatigue: Secondary | ICD-10-CM

## 2023-07-08 DIAGNOSIS — E785 Hyperlipidemia, unspecified: Secondary | ICD-10-CM

## 2023-07-08 DIAGNOSIS — I340 Nonrheumatic mitral (valve) insufficiency: Secondary | ICD-10-CM

## 2023-07-08 DIAGNOSIS — M7541 Impingement syndrome of right shoulder: Secondary | ICD-10-CM | POA: Diagnosis not present

## 2023-07-08 DIAGNOSIS — I2510 Atherosclerotic heart disease of native coronary artery without angina pectoris: Secondary | ICD-10-CM

## 2023-07-08 DIAGNOSIS — I4819 Other persistent atrial fibrillation: Secondary | ICD-10-CM

## 2023-07-11 ENCOUNTER — Encounter: Payer: Self-pay | Admitting: Cardiology

## 2023-07-11 NOTE — Telephone Encounter (Signed)
Pt called

## 2023-07-12 DIAGNOSIS — M7541 Impingement syndrome of right shoulder: Secondary | ICD-10-CM | POA: Diagnosis not present

## 2023-07-13 DIAGNOSIS — L821 Other seborrheic keratosis: Secondary | ICD-10-CM | POA: Diagnosis not present

## 2023-07-13 DIAGNOSIS — D044 Carcinoma in situ of skin of scalp and neck: Secondary | ICD-10-CM | POA: Diagnosis not present

## 2023-07-13 DIAGNOSIS — Z85828 Personal history of other malignant neoplasm of skin: Secondary | ICD-10-CM | POA: Diagnosis not present

## 2023-07-13 DIAGNOSIS — D1801 Hemangioma of skin and subcutaneous tissue: Secondary | ICD-10-CM | POA: Diagnosis not present

## 2023-07-13 DIAGNOSIS — L814 Other melanin hyperpigmentation: Secondary | ICD-10-CM | POA: Diagnosis not present

## 2023-07-13 DIAGNOSIS — L570 Actinic keratosis: Secondary | ICD-10-CM | POA: Diagnosis not present

## 2023-07-13 DIAGNOSIS — D0472 Carcinoma in situ of skin of left lower limb, including hip: Secondary | ICD-10-CM | POA: Diagnosis not present

## 2023-07-19 ENCOUNTER — Encounter: Payer: Self-pay | Admitting: Dermatology

## 2023-07-19 DIAGNOSIS — M7541 Impingement syndrome of right shoulder: Secondary | ICD-10-CM | POA: Diagnosis not present

## 2023-07-20 DIAGNOSIS — C61 Malignant neoplasm of prostate: Secondary | ICD-10-CM | POA: Diagnosis not present

## 2023-07-21 DIAGNOSIS — M7541 Impingement syndrome of right shoulder: Secondary | ICD-10-CM | POA: Diagnosis not present

## 2023-07-27 DIAGNOSIS — C61 Malignant neoplasm of prostate: Secondary | ICD-10-CM | POA: Diagnosis not present

## 2023-07-27 DIAGNOSIS — M7541 Impingement syndrome of right shoulder: Secondary | ICD-10-CM | POA: Diagnosis not present

## 2023-07-29 DIAGNOSIS — M7541 Impingement syndrome of right shoulder: Secondary | ICD-10-CM | POA: Diagnosis not present

## 2023-08-09 DIAGNOSIS — M25511 Pain in right shoulder: Secondary | ICD-10-CM | POA: Diagnosis not present

## 2023-08-24 ENCOUNTER — Telehealth: Payer: Self-pay | Admitting: *Deleted

## 2023-08-24 DIAGNOSIS — M25511 Pain in right shoulder: Secondary | ICD-10-CM | POA: Diagnosis not present

## 2023-08-24 NOTE — Telephone Encounter (Signed)
   Pre-operative Risk Assessment    Patient Name: JEREMIYAH MORGESE, [name]  DOB: 11/17/1950 MRN: [id number]  DATE OF LAST VISIT: 04/16/22 DR. HOCHREIN DATE OF NEXT VISIT: 09/12/23 DR. HOCHREIN    Request for Surgical Clearance    Procedure:   RIGHT SHOULDER SCOPE BICEPS TENODESIS  Date of Surgery:  Clearance TBD                                 Surgeon:  DR. Margarita Rana Surgeon's Group or Practice Name:  [facility] Phone number:  [phone] EXT [address] Fax number:  [phone]   Type of Clearance Requested:   - Medical  - Pharmacy:  Hold Rivaroxaban (Xarelto)     Type of Anesthesia:  General  WITH INTERSCALENE BLOCK   Additional requests/questions:    Elpidio Anis   08/24/2023, 10:10 AM

## 2023-08-25 NOTE — Telephone Encounter (Signed)
Patient with diagnosis of afib on Xarelto for anticoagulation.    Procedure: RIGHT SHOULDER SCOPE BICEPS TENODESIS  Date of procedure: TBD   CHA2DS2-VASc Score = 2   This indicates a 2.2% annual risk of stroke. The patient's score is based upon: CHF History: 0 HTN History: 0 Diabetes History: 0 Stroke History: 0 Vascular Disease History: 1 Age Score: 1 Gender Score: 0      CrCl 91.5 ml/min Platelet count 224  Per office protocol, patient can hold Xarelto for 3 days prior to procedure.    **This guidance is not considered finalized until pre-operative APP has relayed final recommendations.**

## 2023-08-25 NOTE — Telephone Encounter (Signed)
   Name: Grant Griffin, [name]  DOB: Aug 05, 1951  MRN: [id number]  Primary Cardiologist: Rollene Rotunda, MD  Chart reviewed as part of pre-operative protocol coverage. The patient has an upcoming visit scheduled with Dr. Antoine Poche on 09/12/2023 at which time clearance can be addressed in case there are any issues that would impact surgical recommendations.   I added preop FYI to appointment note so that provider is aware to address at time of outpatient visit.  Per office protocol the cardiology provider should forward their finalized clearance decision and recommendations regarding antiplatelet therapy to the requesting party below.    Per office protocol, patient can hold Xarelto for 3 days prior to procedure.    I will route this message as FYI to requesting party and remove this message from the preop box as separate preop APP input not needed at this time.   Please call with any questions.  Napoleon Form, Leodis Rains, NP  08/25/2023, 3:26 PM

## 2023-09-10 ENCOUNTER — Other Ambulatory Visit: Payer: Self-pay | Admitting: Cardiology

## 2023-09-10 DIAGNOSIS — I4819 Other persistent atrial fibrillation: Secondary | ICD-10-CM

## 2023-09-10 NOTE — Progress Notes (Deleted)
 Cardiology Office Note:   Date:  09/10/2023  ID:  Grant Griffin, [name], DOB August 24, 1951, MRN [id number] PCP: Perri Ronal PARAS, MD  [facility] Providers Cardiologist:  Lynwood Schilling, MD {  History of Present Illness:   Grant Griffin, [name] is a 73 y.o. male who is referred by Perri Ronal PARAS, MD for evaluation of atrial fib and an abnormal EKG.  At the last visit I set him up for a DCCV.  I sent him for an echo which showed moderate MR.   He had an elevated coronary calcium  as well on CT with a score of 110 which was 45% for his age and gender.  This demonstrated 25 to 49% proximal LAD stenosis.  He had cardioversion on 01/11/2020 and was in sinus rhythm following this.  However, he reverted back to atrial fib.   He has MR and I followed him with a TEE most recently in April 2024.  .  It was mild to moderate.  This was on echo in April.    ***   He***  says he had a little more breathlessness or fatigue when he does something like climb up a flight of stairs.  However, he still very physically active.  He is going to [place] and will do some hiking. The patient denies any new symptoms such as chest discomfort, neck or arm discomfort. There has been no new shortness of breath, PND or orthopnea. There have been no reported palpitations, presyncope or syncope.      ROS: ***  Studies Reviewed:    EKG:       ***  Risk Assessment/Calculations:   {Does this patient have ATRIAL FIBRILLATION?:[id number]} No BP recorded.  {Refresh Note OR Click here to enter BP  :1}***        Physical Exam:   VS:  There were no vitals taken for this visit.   Wt Readings from Last 3 Encounters:  03/21/23 185 lb (83.9 kg)  04/16/22 188 lb 9.6 oz (85.5 kg)  03/04/22 188 lb (85.3 kg)     GEN: Well nourished, well developed in no acute distress NECK: No JVD; No carotid bruits CARDIAC: ***RR, *** murmurs, rubs, gallops RESPIRATORY:  Clear to auscultation without rales, wheezing or rhonchi  ABDOMEN: Soft,  non-tender, non-distended EXTREMITIES:  No edema; No deformity   ASSESSMENT AND PLAN:   ATRIAL FIB:  ***  This is a new diagnosis.  He has a slow ventricular rate and really does not notice this. Grant Griffin, [name] has a CHA2DS2 - VASc score of 1 but we had discussed years ago that he would prefer to be on anticoagulation knowing the risk benefits.  Of note he did see EP couple years ago and they decided not to do ablation.  No change in therapy.   MR:   This was mild in April 2024.  ***   I will follow this with an echo in 1 year.   FATIGUE:   ***  He does have slightly decreased exercise tolerance and some mildly increased fatigue.  He does not sleep through the night because he has been diagnosed with prostate cancer has to get up to go to the bathroom several times.  He had treatment of this.  I think there is a small chance this fatigue could be related to something like chronotropic incompetence as he has some conduction delay and he and I talked about this.  He will pay attention to his heart rate  when he is exercising.   CAD:   He has nonobstructive coronary disease.  ***  We will continue with aggressive risk reduction.  He had no symptoms.   DYSLIPIDEMIA:   LDL was *** 45 with an HDL of 99.  No change in therapy.        Follow up ***  Signed, Lynwood Schilling, MD

## 2023-09-12 ENCOUNTER — Ambulatory Visit: Payer: PPO | Admitting: Cardiology

## 2023-09-12 DIAGNOSIS — I482 Chronic atrial fibrillation, unspecified: Secondary | ICD-10-CM

## 2023-09-12 DIAGNOSIS — I340 Nonrheumatic mitral (valve) insufficiency: Secondary | ICD-10-CM

## 2023-09-12 NOTE — Progress Notes (Signed)
 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 Schilling and presents today for pre-op evaluation.   Patient was referred to Dr. Schilling in 12/2019 for further evaluation fpr an irregular heart rhythm. EKG at that visit showed rate controlled atrial fibrillation with lateral T wave inversions. Echo and coronary calcium  score were ordered for further evaluation. He was also started on Xarelto  with plans for a cardioversion in the future. Echo showed LVEF of 55-60% with normal wall motion, normal RV size and function, and moderate MR. Coronary calcium  score came back at 110 (45th percentile for age and sex) and coronary calcium  was only noted in the LAD. He underwent a DCCV in 01/2020 with restoration of sinus rhythm. Unfortunately, he was noted to be back in atrial fibrillation at follow-up visit later that month. A full coronary CTA was completed in 02/2020 and showed a coronary calcium  score of 122 (47th percentile) and mild non-obstructive CAD (25-49%) in the proximal LAD. TEE in 01/2021 showed LVEF of 60-65% with moderate MR and a PFO.   Patient was last seen by Dr. Schilling in 04/2022 at which time he reported feeling a little more breathless and fatigued with climbing up a flight of stairs. However, he was still staying very active and was overall doing well.   Last Echo in 12/2022 for further evaluation of his valvular disease showed LVEF of 55-60% with normal wall motion and a thickened LV in the apex  consistent with apical variant hypertrophic cardiomyopathy, normal RV size and function, moderate biatrial enlargement, mild MR, and a PFO vs small ASD noted of the interatrial septum.   Patient presents today pre-op evaluation for upcoming shoulder surgery. He is doing well from a cardiac standpoint. He reports feeling a little winded when going up a couple of flights of steps and a little fatigued throughout the day but this is unchanged from last visit. He remains very active. He walks 3 miles about 3 times per week and also swims and goes hiking. He occasionally has to stop and catch his breath a little while swimming and hiking but again this is unchanged from last visit. No chest pain. He denies any any shortness of breath at rest, orthopnea, or PND. He reports occasional very mild edema, especially if he has been on his feet all day but this improves with elevation of his leg. No significant edema. No palpitations, lightheadedness, or edema.   EKGs/Labs/Other Studies Reviewed:    The following studies were reviewed:  Coronary CTA 02/27/2020: Impressions: 1. Coronary calcium  score of 122. This was 87 percentile for age and sex matched control. 2. Normal coronary origin with right dominance. 3. CAD-RADS 2. Mild non-obstructive CAD (25-49%) in the proximal LAD. Consider non-atherosclerotic causes of chest pain. Consider preventive therapy and risk factor modification. _______________  TEE 01/09/2021: Impressions: 1. Left ventricular ejection fraction, by estimation, is 60 to 65%. The  left ventricle has normal function.   2. Right ventricular systolic function  is normal. The right ventricular  size is normal.   3. Left atrial size was moderately dilated. No left atrial/left atrial  appendage thrombus was detected.   4. Right atrial size was mildly dilated.   5. Carpentier Type I mitral regurgitation with mld annular dilitation.  There are a few jets of centrally directed MR, with one  more prominent  jet. Overall appears moderate in severity.  _______________  TTE 12/22/2022: Impressions: 1. Left ventricular ejection fraction, by estimation, is 55 to 60%. The  left ventricle has normal function. The left ventricle has no regional  wall motion abnormalities. LV appears more thickened towards the apex, in  some views could be consistent with  apical variant hypertrophic cardiomyopathy. Left ventricular diastolic  parameters are indeterminate.   2. Right ventricular systolic function is normal. The right ventricular  size is normal. There is mildly elevated pulmonary artery systolic  pressure. The estimated right ventricular systolic pressure is 37.4 mmHg.   3. Left atrial size was moderately dilated.   4. Right atrial size was moderately dilated.   5. The mitral valve is normal in structure. Mild mitral valve  regurgitation. No evidence of mitral stenosis.   6. The aortic valve is tricuspid. Aortic valve regurgitation is not  visualized. No aortic stenosis is present.   7. The inferior vena cava is dilated in size with >50% respiratory  variability, suggesting right atrial pressure of 8 mmHg.   8. There is a PFO versus small ASD noted by color flow across the  interatrial septum.   9. The patient is in atrial fibrillation.    EKG:  EKG ordered today.   EKG Interpretation Date/Time:  Tuesday September 13 2023 08:57:01 EST Ventricular Rate:  50 PR Interval:    QRS Duration:  112 QT Interval:  460 QTC Calculation: 419 R Axis:   -73  Text Interpretation: Atrial fibrillation with slow ventricular response Left axis deviation Right bundle branch block ST depression and T wave inversions in lead V5-V6 When compared with ECG of 20-Aug-2021 09:30, No significant change was found Confirmed by Jadine Patient [phone]) on 09/13/2023 8:59:29 AM    Recent Labs: 03/17/2023: ALT 18; BUN 23; Creat 0.84; Hemoglobin 14.2; Platelets 224; Potassium 5.0; Sodium 141  Recent Lipid  Panel    Component Value Date/Time   CHOL 167 03/17/2023 0911   CHOL 159 05/21/2020 0825   TRIG 42 03/17/2023 0911   HDL 101 03/17/2023 0911   HDL 104 05/21/2020 0825   CHOLHDL 1.7 03/17/2023 0911   VLDL 9 12/04/2015 0911   LDLCALC 54 03/17/2023 0911   LDLDIRECT 101.2 09/03/2009 0000    Physical Exam:    Vital Signs: BP 110/70   Pulse (!) 50   Ht 5' 9 (1.753 m)   Wt 182 lb (82.6 kg)   SpO2 98%   BMI 26.88 kg/m     Wt Readings from Last 3 Encounters:  09/13/23 182 lb (82.6 kg)  03/21/23 185 lb (83.9 kg)  04/16/22 188 lb 9.6 oz (85.5 kg)     General: 73 y.o. Caucasian male in no acute distress. HEENT: Normocephalic and atraumatic. Sclera clear.  Neck: Supple. No carotid bruits. No JVD. Heart: Mildly bradycardic with irregularly irregular rhythm. No murmurs, gallops, or rubs.  Lungs: No increased work of breathing. Clear to ausculation bilaterally. No wheezes, rhonchi, or rales.  Extremities: No lower extremity edema.   Skin: Warm and dry. Neuro: No focal deficits. Psych: Normal affect. Responds appropriately.  Assessment:  1. Pre-op evaluation   2. Non-obstructive CAD   3. Persistent atrial fibrillation (HCC)   4. Apical variant hypertrophic cardiomyopathy (HCC)   5. Mitral valve insufficiency, unspecified etiology   6. Dyslipidemia   7. PFO (patent foramen ovale)     Plan:    Pre-Op Evaluation  Patient is scheduled to have an upcoming right shoulder scope biceps tenodesis. He is doing well from a cardiac standpoint and is able to complete >4.0 METS of physical activity without chest pain or significant shortness of breath. Per Revised Cardiac Risk Index, risk of a major cardiac event perioperatively is 3.9%. Therefore, based on ACC/AHA guidelines, patient would be at acceptable risk for the planned procedure without further cardiovascular testing. Per office protocol and Pharmacy, okay to hold Xarelto  for 3 days prior to procedure. Please restart this as soon  as safely possible postoperatively. I will route this recommendation to the requesting party via Epic fax function.  Non-Obstructive CAD Coronary CTA in 02/2020 showed a coronary calcium  score of 122 (47th percentile) and mild non-obstructive CAD (25-49%) in the proximal LAD. - No chest pain.  - No aspirin due to need for anticoagulation.  - Continue statin.   Persistent Atrial Fibrillation Initially diagnosed in 12/2019. He underwent DCCV in 01/2020 but had quick return of atrial fibrillation later that month.  - Rate controlled.  - Not on any AV nodal agents due to low resting heart rates. - Continue chronic anticoagulation with Xarelto  20mg  daily.   Apical Variant Hypertrophic Cardiomyopathy Last Echo in 12/2022 showed LVEF of 55-60% with normal wall motion and a thickened LV in the apex consistent with apical variant hypertrophic cardiomyopathy, normal RV size and function, moderate biatrial enlargement, mild MR, and a PFO vs small ASD noted of the interatrial septum.  - He describes some mild dyspnea with more strenuous activity which is unchanged from last visit. No other signs or symptoms of CHF. Euvolemic on exam.  - Will repeat Echo in 12/2023 (does not have to be done prior to shoulder surgery).  Mild to Moderate Mitral Regurgitation TEE in 01/2021 showed moderate MR but most recent TTE in 12/2022 showed only mild MR.  - Will repeat TTE in 12/2023.   Dyslipidemia Lipid panel in 03/2023: Total Cholesterol 167, Triglycerides 42, HDL 101, LDL 54.  - Continue Lipitor 20mg  daily.  - Labs followed by PCP.  PFO Noted on TEE in 01/2021 and TTE in 12/2023. No history of stroke.  - Continue to monitor. No plans for surgical intervention at this time.   Disposition: Follow up in 1 year.    Signed, Aline FORBES Door, PA-C  09/13/2023 9:31 AM    [facility]

## 2023-09-12 NOTE — Telephone Encounter (Signed)
 Prescription refill request for Xarelto received.  Indication:afib Last office visit:upcoming Weight:83.9  kg Age:73 Scr:0.84  7/24 CrCl:95.85  ml/min  Prescription refilled

## 2023-09-13 ENCOUNTER — Other Ambulatory Visit: Payer: Self-pay | Admitting: Cardiology

## 2023-09-13 ENCOUNTER — Encounter: Payer: Self-pay | Admitting: Student

## 2023-09-13 ENCOUNTER — Ambulatory Visit: Payer: PPO | Attending: Student | Admitting: Student

## 2023-09-13 VITALS — BP 110/70 | HR 50 | Ht 69.0 in | Wt 182.0 lb

## 2023-09-13 DIAGNOSIS — Z01818 Encounter for other preprocedural examination: Secondary | ICD-10-CM | POA: Diagnosis not present

## 2023-09-13 DIAGNOSIS — I340 Nonrheumatic mitral (valve) insufficiency: Secondary | ICD-10-CM | POA: Diagnosis not present

## 2023-09-13 DIAGNOSIS — E785 Hyperlipidemia, unspecified: Secondary | ICD-10-CM | POA: Diagnosis not present

## 2023-09-13 DIAGNOSIS — Q2112 Patent foramen ovale: Secondary | ICD-10-CM | POA: Diagnosis not present

## 2023-09-13 DIAGNOSIS — I2510 Atherosclerotic heart disease of native coronary artery without angina pectoris: Secondary | ICD-10-CM

## 2023-09-13 DIAGNOSIS — I422 Other hypertrophic cardiomyopathy: Secondary | ICD-10-CM

## 2023-09-13 DIAGNOSIS — I4819 Other persistent atrial fibrillation: Secondary | ICD-10-CM | POA: Diagnosis not present

## 2023-09-13 NOTE — Patient Instructions (Signed)
 Medication Instructions:  Hold Xarelto  for 3 days prior to your surgery *If you need a refill on your cardiac medications before your next appointment, please call your pharmacy*  Testing/Procedures: Your physician has requested that you have an echocardiogram in April 2025. Echocardiography is a painless test that uses sound waves to create images of your heart. It provides your doctor with information about the size and shape of your heart and how well your heart's chambers and valves are working. This procedure takes approximately one hour. There are no restrictions for this procedure. Please do NOT wear cologne, perfume, aftershave, or lotions (deodorant is allowed). Please arrive 15 minutes prior to your appointment time.  Please note: We ask at that you not bring children with you during ultrasound (echo/ vascular) testing. Due to room size and safety concerns, children are not allowed in the ultrasound rooms during exams. Our front office staff cannot provide observation of children in our lobby area while testing is being conducted. An adult accompanying a patient to their appointment will only be allowed in the ultrasound room at the discretion of the ultrasound technician under special circumstances. We apologize for any inconvenience.    Follow-Up: At [facility], you and your health needs are our priority.  As part of our continuing mission to provide you with exceptional heart care, we have created designated Provider Care Teams.  These Care Teams include your primary Cardiologist (physician) and Advanced Practice Providers (APPs -  Physician Assistants and Nurse Practitioners) who all work together to provide you with the care you need, when you need it.  We recommend signing up for the patient portal called MyChart.  Sign up information is provided on this After Visit Summary.  MyChart is used to connect with patients for Virtual Visits (Telemedicine).  Patients are able to  view lab/test results, encounter notes, upcoming appointments, etc.  Non-urgent messages can be sent to your provider as well.   To learn more about what you can do with MyChart, go to [email or web address].    Your next appointment:   1 year(s)  Provider:   Dr Lavona or Callie Goodrich, PA-C

## 2023-10-06 DIAGNOSIS — Y999 Unspecified external cause status: Secondary | ICD-10-CM | POA: Diagnosis not present

## 2023-10-06 DIAGNOSIS — G8918 Other acute postprocedural pain: Secondary | ICD-10-CM | POA: Diagnosis not present

## 2023-10-06 DIAGNOSIS — X58XXXA Exposure to other specified factors, initial encounter: Secondary | ICD-10-CM | POA: Diagnosis not present

## 2023-10-06 DIAGNOSIS — M7521 Bicipital tendinitis, right shoulder: Secondary | ICD-10-CM | POA: Diagnosis not present

## 2023-10-06 DIAGNOSIS — M24111 Other articular cartilage disorders, right shoulder: Secondary | ICD-10-CM | POA: Diagnosis not present

## 2023-10-06 DIAGNOSIS — M7541 Impingement syndrome of right shoulder: Secondary | ICD-10-CM | POA: Diagnosis not present

## 2023-10-06 DIAGNOSIS — M19011 Primary osteoarthritis, right shoulder: Secondary | ICD-10-CM | POA: Diagnosis not present

## 2023-10-06 DIAGNOSIS — S43431A Superior glenoid labrum lesion of right shoulder, initial encounter: Secondary | ICD-10-CM | POA: Diagnosis not present

## 2023-10-10 DIAGNOSIS — M7541 Impingement syndrome of right shoulder: Secondary | ICD-10-CM | POA: Diagnosis not present

## 2023-10-19 DIAGNOSIS — M7541 Impingement syndrome of right shoulder: Secondary | ICD-10-CM | POA: Diagnosis not present

## 2023-10-21 DIAGNOSIS — M7541 Impingement syndrome of right shoulder: Secondary | ICD-10-CM | POA: Diagnosis not present

## 2023-10-26 DIAGNOSIS — M7541 Impingement syndrome of right shoulder: Secondary | ICD-10-CM | POA: Diagnosis not present

## 2023-10-28 DIAGNOSIS — M7541 Impingement syndrome of right shoulder: Secondary | ICD-10-CM | POA: Diagnosis not present

## 2023-11-02 DIAGNOSIS — M7541 Impingement syndrome of right shoulder: Secondary | ICD-10-CM | POA: Diagnosis not present

## 2023-11-04 DIAGNOSIS — M7541 Impingement syndrome of right shoulder: Secondary | ICD-10-CM | POA: Diagnosis not present

## 2023-11-08 DIAGNOSIS — M7541 Impingement syndrome of right shoulder: Secondary | ICD-10-CM | POA: Diagnosis not present

## 2023-11-10 DIAGNOSIS — M7541 Impingement syndrome of right shoulder: Secondary | ICD-10-CM | POA: Diagnosis not present

## 2023-11-15 DIAGNOSIS — M7541 Impingement syndrome of right shoulder: Secondary | ICD-10-CM | POA: Diagnosis not present

## 2023-11-18 DIAGNOSIS — M7541 Impingement syndrome of right shoulder: Secondary | ICD-10-CM | POA: Diagnosis not present

## 2023-11-29 DIAGNOSIS — M7541 Impingement syndrome of right shoulder: Secondary | ICD-10-CM | POA: Diagnosis not present

## 2023-12-01 DIAGNOSIS — M7541 Impingement syndrome of right shoulder: Secondary | ICD-10-CM | POA: Diagnosis not present

## 2023-12-05 DIAGNOSIS — M7541 Impingement syndrome of right shoulder: Secondary | ICD-10-CM | POA: Diagnosis not present

## 2023-12-07 DIAGNOSIS — M25511 Pain in right shoulder: Secondary | ICD-10-CM | POA: Diagnosis not present

## 2023-12-07 DIAGNOSIS — S46011D Strain of muscle(s) and tendon(s) of the rotator cuff of right shoulder, subsequent encounter: Secondary | ICD-10-CM | POA: Diagnosis not present

## 2023-12-09 DIAGNOSIS — S46011D Strain of muscle(s) and tendon(s) of the rotator cuff of right shoulder, subsequent encounter: Secondary | ICD-10-CM | POA: Diagnosis not present

## 2023-12-09 DIAGNOSIS — M25511 Pain in right shoulder: Secondary | ICD-10-CM | POA: Diagnosis not present

## 2023-12-13 ENCOUNTER — Other Ambulatory Visit: Payer: Self-pay | Admitting: Cardiology

## 2023-12-13 DIAGNOSIS — I4819 Other persistent atrial fibrillation: Secondary | ICD-10-CM

## 2023-12-13 NOTE — Telephone Encounter (Signed)
 Prescription refill request for Xarelto received.  Indication: AF Last office visit: 09/13/23  Thane Edu PA-C Weight: 82.6kg Age: 73 Scr: 0.84 on 03/17/23  Epic CrCl: 90.14  Based on above findings Xarelto 20mg  daily is the appropriate dose.  Refill approved.

## 2023-12-29 DIAGNOSIS — M25511 Pain in right shoulder: Secondary | ICD-10-CM | POA: Diagnosis not present

## 2023-12-29 DIAGNOSIS — S46011D Strain of muscle(s) and tendon(s) of the rotator cuff of right shoulder, subsequent encounter: Secondary | ICD-10-CM | POA: Diagnosis not present

## 2023-12-30 ENCOUNTER — Other Ambulatory Visit (HOSPITAL_COMMUNITY): Payer: PPO

## 2024-01-02 DIAGNOSIS — M25511 Pain in right shoulder: Secondary | ICD-10-CM | POA: Diagnosis not present

## 2024-01-02 DIAGNOSIS — S46011D Strain of muscle(s) and tendon(s) of the rotator cuff of right shoulder, subsequent encounter: Secondary | ICD-10-CM | POA: Diagnosis not present

## 2024-01-06 ENCOUNTER — Ambulatory Visit (HOSPITAL_COMMUNITY): Payer: PPO | Attending: Cardiology

## 2024-01-06 DIAGNOSIS — I422 Other hypertrophic cardiomyopathy: Secondary | ICD-10-CM | POA: Diagnosis not present

## 2024-01-06 LAB — ECHOCARDIOGRAM COMPLETE
Area-P 1/2: 5.31 cm2
MV M vel: 5.1 m/s
MV Peak grad: 103.9 mmHg
Radius: 0.9 cm
S' Lateral: 3.2 cm

## 2024-01-09 DIAGNOSIS — M25511 Pain in right shoulder: Secondary | ICD-10-CM | POA: Diagnosis not present

## 2024-01-09 DIAGNOSIS — I422 Other hypertrophic cardiomyopathy: Secondary | ICD-10-CM

## 2024-01-09 DIAGNOSIS — S46011D Strain of muscle(s) and tendon(s) of the rotator cuff of right shoulder, subsequent encounter: Secondary | ICD-10-CM | POA: Diagnosis not present

## 2024-01-13 DIAGNOSIS — S46011D Strain of muscle(s) and tendon(s) of the rotator cuff of right shoulder, subsequent encounter: Secondary | ICD-10-CM | POA: Diagnosis not present

## 2024-01-13 DIAGNOSIS — M25511 Pain in right shoulder: Secondary | ICD-10-CM | POA: Diagnosis not present

## 2024-01-16 DIAGNOSIS — M25511 Pain in right shoulder: Secondary | ICD-10-CM | POA: Diagnosis not present

## 2024-01-16 DIAGNOSIS — S46011D Strain of muscle(s) and tendon(s) of the rotator cuff of right shoulder, subsequent encounter: Secondary | ICD-10-CM | POA: Diagnosis not present

## 2024-01-18 DIAGNOSIS — D225 Melanocytic nevi of trunk: Secondary | ICD-10-CM | POA: Diagnosis not present

## 2024-01-18 DIAGNOSIS — D1801 Hemangioma of skin and subcutaneous tissue: Secondary | ICD-10-CM | POA: Diagnosis not present

## 2024-01-18 DIAGNOSIS — L570 Actinic keratosis: Secondary | ICD-10-CM | POA: Diagnosis not present

## 2024-01-18 DIAGNOSIS — Z85828 Personal history of other malignant neoplasm of skin: Secondary | ICD-10-CM | POA: Diagnosis not present

## 2024-01-18 DIAGNOSIS — L821 Other seborrheic keratosis: Secondary | ICD-10-CM | POA: Diagnosis not present

## 2024-01-18 DIAGNOSIS — L814 Other melanin hyperpigmentation: Secondary | ICD-10-CM | POA: Diagnosis not present

## 2024-01-19 DIAGNOSIS — C61 Malignant neoplasm of prostate: Secondary | ICD-10-CM | POA: Diagnosis not present

## 2024-01-19 DIAGNOSIS — M25511 Pain in right shoulder: Secondary | ICD-10-CM | POA: Diagnosis not present

## 2024-01-19 DIAGNOSIS — S46011D Strain of muscle(s) and tendon(s) of the rotator cuff of right shoulder, subsequent encounter: Secondary | ICD-10-CM | POA: Diagnosis not present

## 2024-01-23 DIAGNOSIS — M25511 Pain in right shoulder: Secondary | ICD-10-CM | POA: Diagnosis not present

## 2024-01-23 DIAGNOSIS — S46011D Strain of muscle(s) and tendon(s) of the rotator cuff of right shoulder, subsequent encounter: Secondary | ICD-10-CM | POA: Diagnosis not present

## 2024-01-31 DIAGNOSIS — S46011D Strain of muscle(s) and tendon(s) of the rotator cuff of right shoulder, subsequent encounter: Secondary | ICD-10-CM | POA: Diagnosis not present

## 2024-01-31 DIAGNOSIS — M25511 Pain in right shoulder: Secondary | ICD-10-CM | POA: Diagnosis not present

## 2024-01-31 NOTE — Telephone Encounter (Signed)
 Pt informed of providers result & recommendations. Pt verbalized understanding. All questions, if any, were answered. YEARLY ORDER ENTERED

## 2024-02-02 DIAGNOSIS — C61 Malignant neoplasm of prostate: Secondary | ICD-10-CM | POA: Diagnosis not present

## 2024-02-02 DIAGNOSIS — S46011D Strain of muscle(s) and tendon(s) of the rotator cuff of right shoulder, subsequent encounter: Secondary | ICD-10-CM | POA: Diagnosis not present

## 2024-02-02 DIAGNOSIS — M25511 Pain in right shoulder: Secondary | ICD-10-CM | POA: Diagnosis not present

## 2024-02-02 DIAGNOSIS — R351 Nocturia: Secondary | ICD-10-CM | POA: Diagnosis not present

## 2024-02-14 DIAGNOSIS — S46011D Strain of muscle(s) and tendon(s) of the rotator cuff of right shoulder, subsequent encounter: Secondary | ICD-10-CM | POA: Diagnosis not present

## 2024-02-14 DIAGNOSIS — M25511 Pain in right shoulder: Secondary | ICD-10-CM | POA: Diagnosis not present

## 2024-02-20 DIAGNOSIS — S46011D Strain of muscle(s) and tendon(s) of the rotator cuff of right shoulder, subsequent encounter: Secondary | ICD-10-CM | POA: Diagnosis not present

## 2024-02-20 DIAGNOSIS — M25511 Pain in right shoulder: Secondary | ICD-10-CM | POA: Diagnosis not present

## 2024-02-22 DIAGNOSIS — M25511 Pain in right shoulder: Secondary | ICD-10-CM | POA: Diagnosis not present

## 2024-02-22 DIAGNOSIS — S46011D Strain of muscle(s) and tendon(s) of the rotator cuff of right shoulder, subsequent encounter: Secondary | ICD-10-CM | POA: Diagnosis not present

## 2024-03-20 ENCOUNTER — Other Ambulatory Visit: Payer: PPO

## 2024-03-20 DIAGNOSIS — Z0000 Encounter for general adult medical examination without abnormal findings: Secondary | ICD-10-CM | POA: Diagnosis not present

## 2024-03-20 DIAGNOSIS — I482 Chronic atrial fibrillation, unspecified: Secondary | ICD-10-CM | POA: Diagnosis not present

## 2024-03-20 DIAGNOSIS — R7302 Impaired glucose tolerance (oral): Secondary | ICD-10-CM

## 2024-03-20 DIAGNOSIS — Z7901 Long term (current) use of anticoagulants: Secondary | ICD-10-CM | POA: Diagnosis not present

## 2024-03-20 DIAGNOSIS — C61 Malignant neoplasm of prostate: Secondary | ICD-10-CM | POA: Diagnosis not present

## 2024-03-20 DIAGNOSIS — E7800 Pure hypercholesterolemia, unspecified: Secondary | ICD-10-CM

## 2024-03-21 ENCOUNTER — Ambulatory Visit: Payer: Self-pay | Admitting: Internal Medicine

## 2024-03-21 LAB — COMPLETE METABOLIC PANEL WITHOUT GFR
AG Ratio: 1.9 (calc) (ref 1.0–2.5)
ALT: 31 U/L (ref 9–46)
AST: 29 U/L (ref 10–35)
Albumin: 4.3 g/dL (ref 3.6–5.1)
Alkaline phosphatase (APISO): 80 U/L (ref 35–144)
BUN: 19 mg/dL (ref 7–25)
CO2: 29 mmol/L (ref 20–32)
Calcium: 9.5 mg/dL (ref 8.6–10.3)
Chloride: 104 mmol/L (ref 98–110)
Creat: 0.89 mg/dL (ref 0.70–1.28)
Globulin: 2.3 g/dL (ref 1.9–3.7)
Glucose, Bld: 97 mg/dL (ref 65–99)
Potassium: 5.1 mmol/L (ref 3.5–5.3)
Sodium: 141 mmol/L (ref 135–146)
Total Bilirubin: 1 mg/dL (ref 0.2–1.2)
Total Protein: 6.6 g/dL (ref 6.1–8.1)

## 2024-03-21 LAB — LIPID PANEL
Cholesterol: 160 mg/dL (ref <200)
HDL: 99 mg/dL (ref >=40)
LDL Cholesterol (Calc): 50 mg/dL
Non-HDL Cholesterol (Calc): 61 mg/dL (ref <130)
Total CHOL/HDL Ratio: 1.6 (calc) (ref <5.0)
Triglycerides: 37 mg/dL (ref <150)

## 2024-03-21 LAB — CBC WITH DIFFERENTIAL/PLATELET
Absolute Lymphocytes: 1210 {cells}/uL (ref 850–3900)
Absolute Monocytes: 422 {cells}/uL (ref 200–950)
Basophils Absolute: 20 {cells}/uL (ref 0–200)
Basophils Relative: 0.6 %
Eosinophils Absolute: 170 {cells}/uL (ref 15–500)
Eosinophils Relative: 5 %
HCT: 41.3 % (ref 38.5–50.0)
Hemoglobin: 13.4 g/dL (ref 13.2–17.1)
MCH: 30 pg (ref 27.0–33.0)
MCHC: 32.4 g/dL (ref 32.0–36.0)
MCV: 92.4 fL (ref 80.0–100.0)
MPV: 10.8 fL (ref 7.5–12.5)
Monocytes Relative: 12.4 %
Neutro Abs: 1578 {cells}/uL (ref 1500–7800)
Neutrophils Relative %: 46.4 %
Platelets: 223 Thousand/uL (ref 140–400)
RBC: 4.47 Million/uL (ref 4.20–5.80)
RDW: 12.7 % (ref 11.0–15.0)
Total Lymphocyte: 35.6 %
WBC: 3.4 Thousand/uL — ABNORMAL LOW (ref 3.8–10.8)

## 2024-03-21 LAB — MICROALBUMIN / CREATININE URINE RATIO
Creatinine, Urine: 164 mg/dL (ref 20–320)
Microalb Creat Ratio: 4 mg/g{creat} (ref <30)
Microalb, Ur: 0.7 mg/dL

## 2024-03-21 LAB — PSA: PSA: 1.2 ng/mL (ref <=4.00)

## 2024-03-21 LAB — HEMOGLOBIN A1C
Hgb A1c MFr Bld: 5.9 % — ABNORMAL HIGH (ref <5.7)
Mean Plasma Glucose: 123 mg/dL
eAG (mmol/L): 6.8 mmol/L

## 2024-03-22 ENCOUNTER — Encounter: Payer: Self-pay | Admitting: Internal Medicine

## 2024-03-22 ENCOUNTER — Ambulatory Visit (INDEPENDENT_AMBULATORY_CARE_PROVIDER_SITE_OTHER): Payer: PPO | Admitting: Internal Medicine

## 2024-03-22 VITALS — BP 102/70 | HR 70 | Ht 69.0 in | Wt 187.0 lb

## 2024-03-22 DIAGNOSIS — E7800 Pure hypercholesterolemia, unspecified: Secondary | ICD-10-CM

## 2024-03-22 DIAGNOSIS — R7302 Impaired glucose tolerance (oral): Secondary | ICD-10-CM | POA: Diagnosis not present

## 2024-03-22 DIAGNOSIS — Z0000 Encounter for general adult medical examination without abnormal findings: Secondary | ICD-10-CM

## 2024-03-22 DIAGNOSIS — Z7901 Long term (current) use of anticoagulants: Secondary | ICD-10-CM | POA: Diagnosis not present

## 2024-03-22 DIAGNOSIS — Z85828 Personal history of other malignant neoplasm of skin: Secondary | ICD-10-CM

## 2024-03-22 DIAGNOSIS — Z860101 Personal history of adenomatous and serrated colon polyps: Secondary | ICD-10-CM

## 2024-03-22 DIAGNOSIS — C61 Malignant neoplasm of prostate: Secondary | ICD-10-CM

## 2024-03-22 DIAGNOSIS — I340 Nonrheumatic mitral (valve) insufficiency: Secondary | ICD-10-CM | POA: Diagnosis not present

## 2024-03-22 DIAGNOSIS — I4819 Other persistent atrial fibrillation: Secondary | ICD-10-CM | POA: Diagnosis not present

## 2024-03-22 NOTE — Progress Notes (Signed)
 Annual Wellness Visit   Patient Care Team: Kierre Deines, Ronal PARAS, MD as PCP - General (Internal Medicine) Lavona Agent, MD as PCP - Cardiology (Cardiology) Vertell Pont, RN as Oncology Nurse Navigator Selma Donnice SAUNDERS, MD as Consulting Physician (Urology)   Visit Date: 03/22/24   Chief Complaint  Patient presents with   Medicare Wellness   Subjective:  Patient: Grant Griffin, [name], Male DOB: September 25, 1950, 73 y.o. MRN: [id number] Grant Griffin, [name] is a 73 y.o. Male who presents today for his Annual Wellness Visit. 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  Says he recently had  Right Shoulder Surgery for labral tear by Dr Velinda Chancy. Recovering well from this surgery.  History of  Atrial Fibrillation treated with Xarelto  20 mg daily. Blood pressure normal today at 102/70. Cardioversion was tried May 2021 but patient went back into a-fib after that.  History of Hyperlipidemia treated with Atorvastatin  20 mg daily. 03/20/2024 Lipid Panel: WNL. 2021 Coronary Calcium  Score: 110.  History of Impaired Glucose Tolerance with 03/20/2024, compared to 03/2023: HgbA1c 5.9, decreased from 6.1 Glucose 97, elevated from 89.   History of Multiple Skin Lesions followed by [facility] biannually.   Labs 03/20/2024 CBC, compared to 03/2023: WBC 3.4, decreased from 4.1; otherwise WNL.  CMP: WNL   Colonoscopy 11/01/2019 found two 2-4 mm sessile polyps were removed from the proximal descending colon and proximal transverse colon (per pathology found to be a tubular adenoma w/o high-grade dysplasia or malignancy and a hyperplastic polyp);  multiple Small- Mouthed Diverticula in the sigmoid colon with repeat recommendation of 2028.  History of Prostate Cancer discovered in 2022 with a Gleason Score of 7; this was treated with Iodine-125 brachytherapy and is followed biannually by Dr. Lanice, Urologist with his last appointment 02/03/2024. PSA  1.20  03/20/2024, PSA prior to and following his diagnosis levels have been: 1.36 in 2013, 2.14 in 2015, 2.22 in 2017, 3.8 in 2018, 4.6 - 4.9 February & August 2021, 6.2 in 2022, 2.48 in 2023, and 1.01 in 2024.   Vaccine Counseling: UTD on Flu, Shingles 2/2, PNA, and Tdap. Past Medical History:  Diagnosis Date   Anticoagulant long-term use    xarelto --- managed by cardiology   History of COVID-19    per pt positive summer 2020 w/ mild symptoms that resolved   Moderate mitral regurgitation    per TEE 01-09-2021 in epic moderate MR without stenosis , mild RAE, moderate LAE, ef 60-65%   Persistent atrial fibrillation (HCC) 12/2019   cardiologist--- dr edison;  CCTA 02-27-2020 calcium  score=122, mild nonobstructive CAD in prox LAD   Prostate cancer [facility])    urologist--- dr matilda ;  dx 11/ 2022   Raynaud's disease    RBBB (right bundle branch block)    Medical/Surgical History Narrative:  Allergic/Intolerant to: No Known Allergies  Other - Hx of: Sciatica, Allergic Rhinitis, and Raynaud's Phenomenon  Past Surgical History:  Procedure Laterality Date   CARDIOVERSION N/A 01/11/2020   Procedure: CARDIOVERSION;  Surgeon: Okey Vina GAILS, MD;  Location: [facility] ENDOSCOPY;  Service: Cardiovascular;  Laterality: N/A;   CATARACT EXTRACTION W/ INTRAOCULAR LENS IMPLANT Bilateral 07/2014   COLONOSCOPY     MOHS SURGERY     RADIOACTIVE SEED IMPLANT N/A 09/21/2021  Procedure: RADIOACTIVE SEED IMPLANT/BRACHYTHERAPY IMPLANT;  Surgeon: Matilda Senior, MD;  Location: [facility];  Service: Urology;  Laterality: N/A;  90 MINS   ROTATOR CUFF REPAIR Left 2004   SKIN SURGERY      squamous cell removed   SPACE OAR INSTILLATION N/A 09/21/2021   Procedure: SPACE OAR INSTILLATION;  Surgeon: Matilda Senior, MD;  Location: [facility];  Service: Urology;  Laterality: N/A;   TEE WITHOUT CARDIOVERSION N/A 01/09/2021   Procedure: TRANSESOPHAGEAL ECHOCARDIOGRAM (TEE);  Surgeon: Okey Vina GAILS, MD;  Location: [facility] ENDOSCOPY;  Service: Cardiovascular;  Laterality: N/A;   Family History  Problem Relation Age of Onset   Heart disease Father 40       Died of MI   Colon cancer Neg Hx    Esophageal cancer Neg Hx    Stomach cancer Neg Hx    Rectal cancer Neg Hx    Family History Narrative: No Family History of GI Cancers Father, deceased due to an MI at age 64 Mother, deceased late-90s due to Stroke 1 Brother in good health as far as is known 2 Sisters - 1 w/ hx of being Overweight, but both are otherwise in good health as far as is known Social History   Social History Narrative   Married.  Two children - 1 son who is a Education officer, community in practice here locally in [place] and 1 daughter who is a physical therapist and lives in [place]. Enjoys Civil [employer] history. Non-smoker, social alcohol consumption. Retired Education officer, community.  Lives in [place]   Review of Systems  Constitutional:  Negative for chills, fever, malaise/fatigue and weight loss.  HENT:  Negative for hearing loss, sinus pain and sore throat.   Respiratory:  Negative for cough, hemoptysis and shortness of breath.   Cardiovascular:  Negative for chest pain, palpitations, leg swelling and PND.  Gastrointestinal:  Negative for abdominal pain, constipation, diarrhea, heartburn, nausea and vomiting.  Genitourinary:  Negative for dysuria, frequency and urgency.  Musculoskeletal:  Negative for back pain, myalgias and neck pain.  Skin:  Negative for itching and rash.  Neurological:  Negative for dizziness, tingling, seizures and headaches.  Endo/Heme/Allergies:  Negative for polydipsia.  Psychiatric/Behavioral:   Negative for depression. The patient is not nervous/anxious.     Objective:  Vitals: BP 102/70   Pulse 70   Ht 5' 9 (1.753 m)   Wt 187 lb (84.8 kg)   SpO2 98%   BMI 27.62 kg/m  Physical Exam Vitals and nursing note reviewed.  Constitutional:      General: He is awake. He is not in acute distress.    Appearance: Normal appearance. He is not ill-appearing or toxic-appearing.  HENT:     Head: Normocephalic and atraumatic.     Right Ear: Tympanic membrane, ear canal and external ear normal.     Left Ear: Tympanic membrane, ear canal and external ear normal.     Mouth/Throat:     Pharynx: Oropharynx is clear.  Eyes:     Extraocular Movements: Extraocular movements intact.     Pupils: Pupils are equal, round, and reactive to light.  Neck:     Thyroid : No thyroid  mass, thyromegaly or thyroid  tenderness.     Vascular: No carotid bruit.  Cardiovascular:     Rate and Rhythm: Normal rate and regular rhythm. No extrasystoles are present.    Pulses:          Dorsalis pedis pulses are 2+ on the right side and 2+ on  the left side.       Posterior tibial pulses are 2+ on the right side and 2+ on the left side.     Heart sounds: Normal heart sounds. No murmur heard.    No friction rub. No gallop.  Pulmonary:     Effort: Pulmonary effort is normal.     Breath sounds: Normal breath sounds. No decreased breath sounds, wheezing, rhonchi or rales.  Chest:     Chest wall: No mass.  Abdominal:     Palpations: Abdomen is soft. There is no hepatomegaly, splenomegaly or mass.     Tenderness: There is no abdominal tenderness.     Hernia: No hernia is present.  Genitourinary:    Prostate: Normal. Not enlarged, not tender and no nodules present.     Rectum: Normal. Guaiac result negative.     Comments: Prostate exam deferred to Urologist Musculoskeletal:     Cervical back: Normal range of motion.     Right lower leg: No edema.     Left lower leg: No edema.  Lymphadenopathy:     Cervical: No  cervical adenopathy.     Upper Body:     Right upper body: No supraclavicular adenopathy.     Left upper body: No supraclavicular adenopathy.  Skin:    General: Skin is warm and dry.  Neurological:     General: No focal deficit present.     Mental Status: He is alert and oriented to person, place, and time. Mental status is at baseline.     Cranial Nerves: Cranial nerves 2-12 are intact.     Sensory: Sensation is intact.     Motor: Motor function is intact.     Coordination: Coordination is intact.     Gait: Gait is intact.     Deep Tendon Reflexes: Reflexes are normal and symmetric.  Psychiatric:        Attention and Perception: Attention normal.        Mood and Affect: Mood normal.        Speech: Speech normal.        Behavior: Behavior normal. Behavior is cooperative.        Thought Content: Thought content normal.        Cognition and Memory: Cognition and memory normal.        Judgment: Judgment normal.   Most Recent Functional Status Assessment:    03/22/2024    9:51 AM  In your present state of health, do you have any difficulty performing the following activities:  Hearing? 0  Vision? 0  Difficulty concentrating or making decisions? 0  Walking or climbing stairs? 0  Dressing or bathing? 0  Doing errands, shopping? 0  Preparing Food and eating ? N  Using the Toilet? N  In the past six months, have you accidently leaked urine? N  Do you have problems with loss of bowel control? N  Managing your Medications? N  Managing your Finances? N  Housekeeping or managing your Housekeeping? N   Most Recent Fall Risk Assessment:    03/22/2024    9:52 AM  Fall Risk   Falls in the past year? 0  Number falls in past yr: 0  Injury with Fall? 0  Risk for fall due to : No Fall Risks  Follow up Falls prevention discussed;Education provided;Falls evaluation completed   Most Recent Depression Screenings:    03/22/2024   10:03 AM 03/21/2023   10:17 AM  PHQ 2/9 Scores  PHQ - 2  Score 0 0   Most Recent Cognitive Screening:    03/22/2024   10:11 AM  6CIT Screen  What Year? 0 points  What month? 0 points  What time? 0 points  Count back from 20 0 points  Months in reverse 0 points  Repeat phrase 0 points  Total Score 0 points   Results:  Studies Obtained And Personally Reviewed By Me:    Labs:     Component Value Date/Time   NA 141 03/20/2024 0904   NA 141 01/06/2021 1452   K 5.1 03/20/2024 0904   CL 104 03/20/2024 0904   CO2 29 03/20/2024 0904   GLUCOSE 97 03/20/2024 0904   BUN 19 03/20/2024 0904   BUN 18 01/06/2021 1452   CREATININE 0.89 03/20/2024 0904   CALCIUM  9.5 03/20/2024 0904   PROT 6.6 03/20/2024 0904   ALBUMIN 4.1 09/18/2021 0926   AST 29 03/20/2024 0904   ALT 31 03/20/2024 0904   ALKPHOS 56 09/18/2021 0926   BILITOT 1.0 03/20/2024 0904   GFRNONAA >60 09/18/2021 0926   GFRNONAA 92 12/05/2020 0948   GFRAA 107 12/05/2020 0948    Lab Results  Component Value Date   WBC 3.4 (L) 03/20/2024   HGB 13.4 03/20/2024   HCT 41.3 03/20/2024   MCV 92.4 03/20/2024   PLT 223 03/20/2024   Lab Results  Component Value Date   CHOL 160 03/20/2024   HDL 99 03/20/2024   LDLCALC 50 03/20/2024   LDLDIRECT 101.2 09/03/2009   TRIG 37 03/20/2024   CHOLHDL 1.6 03/20/2024   Lab Results  Component Value Date   HGBA1C 5.9 (H) 03/20/2024    Lab Results  Component Value Date   TSH 1.790 12/14/2019    Assessment & Plan:  Other Labs Reviewed today: CBC, compared to 03/2023: WBC 3.4, decreased from 4.1; otherwise WNL.  CMP: WNL   Says he recently had Shoulder Surgery for labral tear, which he has been recovering from. Had labral tear repair by Dr. Velinda Chancy  Persistent Atrial Fibrillation treated with Xarelto  20 mg daily. Blood Pressure: normal today at 102/70. Followed by [facility]  Hyperlipidemia treated with Atorvastatin  20 mg daily. 03/20/2024 Lipid Panel: WNL. 2021 Coronary Calcium  Score: 110.  Impaired Glucose Tolerance with  03/20/2024, compared to 03/2023: HgbA1c 5.9, decreased from 6.1 Glucose 97, elevated from 89.   Multiple Skin Lesions followed by [facility] biannually.  Hx of prostate cancer followed by [facility],  Dr. Selma and was  last seen May 2025. Was dx in 2023 and treated with brachy therapy    Colonoscopy 11/01/2019 found two 2-4 mm sessile polyps were removed from the proximal descending colon and proximal transverse colon (per pathology found to be a tubular adenoma w/o high-grade dysplasia or malignancy and a hyperplastic polyp); multiple Small- Mouthed Diverticula in the sigmoid colon with repeat recommendation of 2028.  Prostate Cancer discovered in 2022 with a Gleason Score of 7; this was treated with Iodine-125 brachytherapy and is followed biannually by Dr. Lanice, Urologist with his last appointment 02/03/2024. PSA  1.20  03/20/2024, PSA prior to and following his diagnosis levels have been: 1.36 in 2013, 2.14 in 2015, 2.22 in 2017, 3.8 in 2018, 4.6 - 4.9 February & August 2021, 6.2 in 2022, 2.48 in 2023, and 1.01 in 2024.   Vaccine Counseling: UTD on Flu, Shingles 2/2, PNA, and Tdap.  Return in 1 year (on 03/29/2025) for annual labs, and then on 04/01/3035 for annual visit, or as needed.  Annual wellness visit done today including the all of the following: Reviewed patient's Family Medical History Reviewed and updated list of patient's medical providers Assessment of cognitive impairment was done Assessed patient's functional ability Established a written schedule for health screening services Health Risk Assessent Completed and Reviewed  Discussed health benefits of physical activity, and encouraged him to engage in regular exercise appropriate for his age and condition.    I,Emily Lagle,acting as a Neurosurgeon for Ronal JINNY Hailstone, MD.,have documented all relevant documentation on the behalf of Ronal JINNY Hailstone, MD,as directed by  Ronal JINNY Hailstone, MD while in the presence of Ronal JINNY Hailstone, MD.   I, Ronal JINNY Hailstone, MD, have reviewed all documentation for this visit. The documentation on 04/01/24 for the exam, diagnosis, procedures, and orders are all accurate and complete.

## 2024-03-22 NOTE — Progress Notes (Signed)
 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  Visit Complete: In person  Patient Medicare AWV questionnaire was completed by the patient on 03/22/24; I have confirmed that all information answered by patient is correct and no changes since this date.  Cardiac Risk Factors include: advanced age (>23men, >52 women);male gender;dyslipidemia     Objective:    Today's Vitals   03/22/24 0952  BP: 102/70  Pulse: 70  SpO2: 98%  Weight: 187 lb (84.8 kg)  Height: 5' 9 (1.753 m)   Body mass index is 27.62 kg/m.     03/21/2023   10:18 AM 03/04/2022    2:01 PM 10/15/2021   11:15 AM 09/21/2021   11:16 AM 08/20/2021    8:19 AM 07/07/2021    8:18 AM 01/09/2021    6:59 AM  Advanced Directives  Does Patient Have a Medical Advance Directive? Yes Yes Yes No Yes Yes Yes  Type of Estate agent of [employer] Power of [place];Living will Living will;Healthcare Power of Attorney  Living will;Healthcare Power of Attorney Living will;Healthcare Power of [employer] Power of [place];Living will  Does patient want to make changes to medical advance directive? No - Patient declined No - Patient declined No - Patient declined      Copy of Healthcare Power of Attorney in Chart?  No - copy requested     No - copy requested  Would patient like information on creating a medical advance directive?    No - Patient declined       Current Medications (verified) Outpatient Encounter Medications as of 03/22/2024  Medication Sig   atorvastatin  (LIPITOR) 20 MG tablet TAKE 1 TABLET(20 MG) BY MOUTH DAILY   XARELTO  20 MG TABS tablet TAKE 1 TABLET(20 MG) BY MOUTH DAILY WITH SUPPER   No facility-administered encounter medications on file as of 03/22/2024.    Allergies (verified) Patient has no known allergies.   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   Prostate cancer [facility])    urologist--- dr matilda ;  dx 11/ 2022   Raynaud's disease    RBBB (right bundle branch block)    Past Surgical History:  Procedure Laterality Date   CARDIOVERSION N/A 01/11/2020   Procedure: CARDIOVERSION;  Surgeon: Okey Vina GAILS, MD;  Location: [facility] ENDOSCOPY;  Service: Cardiovascular;  Laterality: N/A;   CATARACT EXTRACTION W/ INTRAOCULAR LENS IMPLANT Bilateral 07/2014   COLONOSCOPY     MOHS SURGERY     RADIOACTIVE SEED IMPLANT N/A 09/21/2021   Procedure: RADIOACTIVE SEED IMPLANT/BRACHYTHERAPY IMPLANT;  Surgeon: matilda Senior, MD;  Location: [facility];  Service: Urology;  Laterality: N/A;  90 MINS   ROTATOR CUFF REPAIR Left 2004   SKIN SURGERY     squamous cell removed   SPACE OAR INSTILLATION N/A 09/21/2021   Procedure: SPACE OAR INSTILLATION;  Surgeon: matilda Senior, MD;  Location: [facility];  Service: Urology;  Laterality: N/A;   TEE WITHOUT CARDIOVERSION N/A 01/09/2021  Procedure: TRANSESOPHAGEAL ECHOCARDIOGRAM (TEE);  Surgeon: Okey Vina GAILS, MD;  Location: [facility] ENDOSCOPY;  Service: Cardiovascular;  Laterality: N/A;   Family History  Problem Relation Age of Onset   Heart disease Father 50       Died of MI   Colon cancer Neg Hx    Esophageal cancer Neg Hx    Stomach cancer Neg Hx    Rectal cancer Neg Hx    Social History   Socioeconomic History   Marital status: Married    Spouse name: Not on file   Number of children: Not on file   Years of education: Not on file   Highest education level: Professional school degree  (e.g., MD, [name], DVM, JD)  Occupational History   Not on file  Tobacco Use   Smoking status: Never   Smokeless tobacco: Never  Vaping Use   Vaping status: Never Used  Substance and Sexual Activity   Alcohol use: Yes    Comment: socially   Drug use: Never   Sexual activity: Yes  Other Topics Concern   Not on file  Social History Narrative   Married.  Two children - 1 son who is a Education officer, community in practice here locally in [place] and 1 daughter who is a physical therapist and lives in [place]. Enjoys Civil War history. Non-smoker, social alcohol consumption. Retired Education officer, community.  Lives in [place]   Social Drivers of Health   Financial Resource Strain: Low Risk  (03/16/2024)   Overall Financial Resource Strain (CARDIA)    Difficulty of Paying Living Expenses: Not hard at all  Food Insecurity: No Food Insecurity (03/16/2024)   Hunger Vital Sign    Worried About Running Out of Food in the Last Year: Never true    Ran Out of Food in the Last Year: Never true  Transportation Needs: No Transportation Needs (03/16/2024)   PRAPARE - Administrator, Civil Service (Medical): No    Lack of Transportation (Non-Medical): No  Physical Activity: Insufficiently Active (03/16/2024)   Exercise Vital Sign    Days of Exercise per Week: 3 days    Minutes of Exercise per Session: 20 min  Stress: No Stress Concern Present (03/16/2024)   [employer] of Occupational Health - Occupational Stress Questionnaire    Feeling of Stress: Not at all  Social Connections: Socially Integrated (03/16/2024)   Social Connection and Isolation Panel    Frequency of Communication with Friends and Family: Three times a week    Frequency of Social Gatherings with Friends and Family: Three times a week    Attends Religious Services: More than 4 times per year    Active Member of Clubs or Organizations: Yes    Attends Banker Meetings: 1 to 4 times per year    Marital Status: Married    Tobacco  Counseling Counseling given: Not Answered   Clinical Intake:  Pre-visit preparation completed: Yes  Pain : No/denies pain     BMI - recorded: 27.62 Nutritional Status: BMI 25 -29 Overweight Diabetes: No  How often do you need to have someone help you when you read instructions, pamphlets, or other written materials from your doctor or pharmacy?: 1 - Never  Interpreter Needed?: No  Information entered by :: Kathlynn Porto, CMA   Activities of Daily Living    03/22/2024    9:51 AM 03/16/2024    6:48 AM  In your present state of health, do you have any difficulty performing the following activities:  Hearing?  0 0  Vision? 0 0  Difficulty concentrating or making decisions? 0 0  Walking or climbing stairs? 0 0  Dressing or bathing? 0 0  Doing errands, shopping? 0 0  Preparing Food and eating ? N N  Using the Toilet? N N  In the past six months, have you accidently leaked urine? N N  Do you have problems with loss of bowel control? N N  Managing your Medications? N N  Managing your Finances? N N  Housekeeping or managing your Housekeeping? N N    Patient Care Team: Perri Ronal PARAS, MD as PCP - General (Internal Medicine) Lavona Agent, MD as PCP - Cardiology (Cardiology) Vertell Pont, RN as Oncology Nurse Navigator Selma Donnice SAUNDERS, MD as Consulting Physician (Urology)  Indicate any recent Medical Services you may have received from other than [facility] providers in the past year (date may be approximate).     Assessment:   This is a routine wellness examination for [place].  Hearing/Vision screen No results found.   Goals Addressed   None    Depression Screen    03/22/2024   10:03 AM 03/21/2023   10:17 AM 08/26/2022   11:33 AM 07/26/2022    4:31 PM 03/04/2022    2:02 PM 12/09/2020    2:14 PM 05/20/2020   11:50 AM  PHQ 2/9 Scores  PHQ - 2 Score 0 0 0 0 0 1 0    Fall Risk    03/22/2024    9:52 AM 03/16/2024    6:48 AM 03/21/2023   10:19 AM 08/26/2022   11:33 AM  07/26/2022    4:31 PM  Fall Risk   Falls in the past year? 0 0 0 0 0  Number falls in past yr: 0 0 0 0 0  Injury with Fall? 0 0 0 0 0  Risk for fall due to : No Fall Risks  No Fall Risks No Fall Risks No Fall Risks  Follow up Falls prevention discussed;Education provided;Falls evaluation completed   Falls prevention discussed  Falls evaluation completed      Data saved with a previous flowsheet row definition    MEDICARE RISK AT HOME: Medicare Risk at Home Any stairs in or around the home?: Yes If so, are there any without handrails?: No Home free of loose throw rugs in walkways, pet beds, electrical cords, etc?: Yes Adequate lighting in your home to reduce risk of falls?: Yes Life alert?: No Use of a cane, walker or w/c?: No Grab bars in the bathroom?: No Shower chair or bench in shower?: No Elevated toilet seat or a handicapped toilet?: No  TIMED UP AND GO:  Was the test performed?  No    Cognitive Function:        03/22/2024   10:11 AM 03/04/2022    2:03 PM  6CIT Screen  What Year? 0 points 0 points  What month? 0 points 0 points  What time? 0 points 0 points  Count back from 20 0 points 0 points  Months in reverse 0 points 0 points  Repeat phrase 0 points 0 points  Total Score 0 points 0 points    Immunizations Immunization History  Administered Date(s) Administered   Fluad Quad(high Dose 65+) 06/19/2020   Influenza Inj Mdck Quad Pf 06/21/2019   Influenza Split 06/29/2013   Influenza Whole 06/06/2008, 06/05/2009   Influenza, High Dose Seasonal PF 07/14/2015, 05/24/2019   Influenza,inj,Quad PF,6+ Mos 06/27/2018   Influenza-Unspecified 07/14/2015, 06/09/2022   PFIZER(Purple  Top)SARS-COV-2 Vaccination 09/28/2019, 10/19/2019, 07/26/2020   PNEUMOCOCCAL CONJUGATE-20 03/04/2022   Pneumococcal Conjugate-13 10/15/2014   Pneumococcal Polysaccharide-23 12/23/2015   Td 07/03/1999, 06/05/2009   Tdap 10/29/2019   Zoster Recombinant(Shingrix) 04/26/2018, 09/12/2018    Zoster, Live 10/15/2014    TDAP status: Up to date  Flu Vaccine status: Up to date  Pneumococcal vaccine status: Up to date  Covid-19 vaccine status: Information provided on how to obtain vaccines.   Qualifies for Shingles Vaccine? Yes   Zostavax completed Yes   Shingrix Completed?: Yes  Screening Tests Health Maintenance  Topic Date Due   INFLUENZA VACCINE  04/06/2024   Medicare Annual Wellness (AWV)  03/20/2025   Colonoscopy  10/31/2026   DTaP/Tdap/Td (4 - Td or Tdap) 10/28/2029   Pneumococcal Vaccine: 50+ Years  Completed   Zoster Vaccines- Shingrix  Completed   Hepatitis B Vaccines  Aged Out   HPV VACCINES  Aged Out   Meningococcal B Vaccine  Aged Out   COVID-19 Vaccine  Discontinued   Hepatitis C Screening  Discontinued    Health Maintenance  There are no preventive care reminders to display for this patient.  Colorectal cancer screening: Type of screening: Colonoscopy. Completed 11/01/2019. Repeat every 7 years  Lung Cancer Screening: (Low Dose CT Chest recommended if Age 33-80 years, 20 pack-year currently smoking OR have quit w/in 15years.) does not qualify.    Additional Screening:  Hepatitis C Screening: does not qualify;   Vision Screening: Recommended annual ophthalmology exams for early detection of glaucoma and other disorders of the eye. Is the patient up to date with their annual eye exam?  Yes   If pt is not established with a provider, would they like to be referred to a provider to establish care? No .   Dental Screening: Recommended annual dental exams for proper oral hygiene   Community Resource Referral / Chronic Care Management: CRR required this visit?  No   CCM required this visit?  No     Plan:     I have personally reviewed and noted the following in the patient's chart:   Medical and social history Use of alcohol, tobacco or illicit drugs  Current medications and supplements including opioid prescriptions. Patient is not  currently taking opioid prescriptions. Functional ability and status Nutritional status Physical activity Advanced directives List of other physicians Hospitalizations, surgeries, and ER visits in previous 12 months Vitals Screenings to include cognitive, depression, and falls Referrals and appointments  In addition, I have reviewed and discussed with patient certain preventive protocols, quality metrics, and best practice recommendations. A written personalized care plan for preventive services as well as general preventive health recommendations were provided to patient.     Araceli Zelda, CMA   03/22/2024   After Visit Summary: (In Person-Printed) AVS printed and given to the patient  I, Ronal JINNY Hailstone, MD, have reviewed all documentation for this visit. The documentation on 04/01/24 for the exam, diagnosis, procedures, and orders are all accurate and complete.

## 2024-04-01 ENCOUNTER — Encounter: Payer: Self-pay | Admitting: Internal Medicine

## 2024-04-01 NOTE — Patient Instructions (Addendum)
 It was a pleasure to see you today. Labs are stable. Please continue follow up with Urology and Cardiology. No change in medications. Return in one year or as needed.

## 2024-05-28 DIAGNOSIS — M19011 Primary osteoarthritis, right shoulder: Secondary | ICD-10-CM | POA: Diagnosis not present

## 2024-06-15 ENCOUNTER — Other Ambulatory Visit: Payer: Self-pay | Admitting: Student

## 2024-07-03 DIAGNOSIS — H401431 Capsular glaucoma with pseudoexfoliation of lens, bilateral, mild stage: Secondary | ICD-10-CM | POA: Diagnosis not present

## 2024-07-03 DIAGNOSIS — Z961 Presence of intraocular lens: Secondary | ICD-10-CM | POA: Diagnosis not present

## 2024-07-20 DIAGNOSIS — L821 Other seborrheic keratosis: Secondary | ICD-10-CM | POA: Diagnosis not present

## 2024-07-20 DIAGNOSIS — C44319 Basal cell carcinoma of skin of other parts of face: Secondary | ICD-10-CM | POA: Diagnosis not present

## 2024-07-20 DIAGNOSIS — C44519 Basal cell carcinoma of skin of other part of trunk: Secondary | ICD-10-CM | POA: Diagnosis not present

## 2024-07-20 DIAGNOSIS — L814 Other melanin hyperpigmentation: Secondary | ICD-10-CM | POA: Diagnosis not present

## 2024-07-20 DIAGNOSIS — Z85828 Personal history of other malignant neoplasm of skin: Secondary | ICD-10-CM | POA: Diagnosis not present

## 2024-07-20 DIAGNOSIS — L570 Actinic keratosis: Secondary | ICD-10-CM | POA: Diagnosis not present

## 2024-07-20 DIAGNOSIS — L281 Prurigo nodularis: Secondary | ICD-10-CM | POA: Diagnosis not present

## 2024-07-25 DIAGNOSIS — C61 Malignant neoplasm of prostate: Secondary | ICD-10-CM | POA: Diagnosis not present

## 2024-08-07 DIAGNOSIS — R3912 Poor urinary stream: Secondary | ICD-10-CM | POA: Diagnosis not present

## 2024-08-07 DIAGNOSIS — R351 Nocturia: Secondary | ICD-10-CM | POA: Diagnosis not present

## 2024-08-07 DIAGNOSIS — N401 Enlarged prostate with lower urinary tract symptoms: Secondary | ICD-10-CM | POA: Diagnosis not present

## 2024-08-24 ENCOUNTER — Encounter: Payer: Self-pay | Admitting: Cardiology

## 2024-09-15 ENCOUNTER — Other Ambulatory Visit: Payer: Self-pay | Admitting: Cardiology

## 2024-09-15 DIAGNOSIS — I4819 Other persistent atrial fibrillation: Secondary | ICD-10-CM

## 2024-10-06 NOTE — Progress Notes (Unsigned)
 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 Schilling and presents today for routine follow-up.   Patient was referred to Dr. Schilling in 12/2019 for further evaluation fpr an irregular heart rhythm. EKG at that visit showed rate controlled atrial fibrillation with lateral T wave inversions. Echo and coronary calcium  score were ordered for further evaluation. He was also started on Xarelto  with plans for a cardioversion in the future. Echo showed LVEF of 55-60% with normal wall motion, normal RV size and function, and moderate MR. Coronary calcium  score came back at 110 (45th percentile for age and sex) and coronary calcium  was only noted in the LAD. He underwent a DCCV in 01/2020 with restoration of sinus rhythm. Unfortunately, he was noted to be back in atrial fibrillation at follow-up visit later that month. A full coronary CTA was completed in 02/2020 and showed a coronary calcium  score of 122 (47th percentile) and mild non-obstructive CAD (25-49%) in the proximal LAD. TEE in 01/2021 showed LVEF of 60-65% with moderate MR and a PFO. Last TTE in 12/2022 showed LVEF of 55-60% with normal wall motion and a thickened LV in the apex consistent with apical variant hypertrophic cardiomyopathy, normal RV size and function, moderate biatrial enlargement, mild MR, and a PFO vs small ASD  noted of the interatrial septum.   He was last seen by me in 09/2023 at which time he was stable from a cardiac standpoint. Repeat Echo in 01/2024 for routine monitoring of mitral valve showed LVEF of 55-60% with moderate LVH of the apical segment, mildly enlarged RV with normal RV function, severe biatrial enlargement, moderate MR, and mildly elevated PASP of 39.8 mmHg.  Patient presents today for follow-up. ***  Non-Obstructive CAD Coronary CTA in 02/2020 showed a coronary calcium  score of 122 (47th percentile) and mild non-obstructive CAD (25-49%) in the proximal LAD. - No chest pain.  - No aspirin due to need for anticoagulation.  - Continue statin.    Persistent Atrial Fibrillation Initially diagnosed in 12/2019. He underwent DCCV in 01/2020 but had quick return of atrial fibrillation later that month.  - Rate controlled. ** - Not on any AV nodal agents due to low resting heart rates. - Continue chronic anticoagulation with Xarelto  20mg  daily.    Apical Variant Hypertrophic Cardiomyopathy Last Echo in 01/2024 showed LVEF of 55-60% with moderate LVH of the apical segment, mildly enlarged RV with normal RV function, severe biatrial enlargement, moderate MR, and mildly elevated PASP of 39.8 mmHg. - He describes some mild dyspnea with more strenuous activity which is unchanged from last visit. No other signs or symptoms of CHF. Euvolemic on exam. ***   Moderate Mitral Regurgitation Noted on TTE in 01/2024.  - Will repeat TTE in 01/2025. ***   Dyslipidemia Lipid panel in 03/2024: Total Cholesterol 160, Triglycerides 37, HDL 99,  LDL 50.  - Continue Lipitor 20mg  daily.  - Labs followed by PCP.   PFO Noted on TEE in 01/2021 and TTE in 12/2022 but was not seen on TTE in 01/2024.  No history of stroke.  - Continue to monitor. No plans for surgical intervention at this time.     EKGs/Labs/Other Studies Reviewed:    The following studies were reviewed:  Coronary CTA 02/27/2020: Impressions: 1.  Coronary calcium  score of 122. This was 64 percentile for age and sex matched control. 2. Normal coronary origin with right dominance. 3. CAD-RADS 2. Mild non-obstructive CAD (25-49%) in the proximal LAD. Consider non-atherosclerotic causes of chest pain. Consider preventive therapy and risk factor modification. _______________  Echocardiogram 01/06/2024: Impressions: 1. Left ventricular ejection fraction, by estimation, is 55 to 60%. The  left ventricle has normal function. The left ventricle has no regional  wall motion abnormalities. There is moderate left ventricular hypertrophy  of the apical segment. Left  ventricular diastolic parameters are indeterminate. The average left  ventricular global longitudinal strain is -21.4 %. The global longitudinal  strain is normal.   2. Right ventricular systolic function is normal. The right ventricular  size is mildly enlarged. There is mildly elevated pulmonary artery  systolic pressure. The estimated right ventricular systolic pressure is  39.8 mmHg.   3. Left atrial size was severely dilated.   4. Right atrial size was severely dilated.   5. The mitral valve is normal in structure. Moderate mitral valve  regurgitation. No evidence of mitral stenosis.   6. The aortic valve is normal in structure. Aortic valve regurgitation is  not visualized. No aortic stenosis is present.   7. The inferior vena cava is normal in size with <50% respiratory  variability, suggesting right atrial pressure of 8 mmHg.   Comparison(s): Prior images reviewed side by side.   Conclusion(s)/Recommendation(s): Findings consistent with hypertrophic  cardiomyopathy _ apical variant.   EKG:  EKG ordered today. EKG personally reviewed and demonstrates ***.  Recent Labs: 03/20/2024: ALT 31; BUN 19; Creat 0.89; Hemoglobin 13.4; Platelets 223; Potassium 5.1; Sodium 141  Recent Lipid Panel    Component Value Date/Time   CHOL 160 03/20/2024 0904   CHOL 159 05/21/2020  0825   TRIG 37 03/20/2024 0904   HDL 99 03/20/2024 0904   HDL 104 05/21/2020 0825   CHOLHDL 1.6 03/20/2024 0904   VLDL 9 12/04/2015 0911   LDLCALC 50 03/20/2024 0904   LDLDIRECT 101.2 09/03/2009 0000    Physical Exam:    Vital Signs: There were no vitals taken for this visit.    Wt Readings from Last 3 Encounters:  03/22/24 187 lb (84.8 kg)  09/13/23 182 lb (82.6 kg)  03/21/23 185 lb (83.9 kg)     General: 74 y.o. male in no acute distress. HEENT: Normocephalic and atraumatic. Sclera clear.  Neck: Supple. No carotid bruits. No JVD. Heart: *** RRR. Distinct S1 and S2. No murmurs, gallops, or rubs.  Lungs: No increased work of breathing. Clear to ausculation bilaterally. No wheezes, rhonchi, or rales.  Abdomen: Soft, non-distended, and non-tender to palpation.  Extremities: No lower extremity edema.  Radial and distal pedal pulses 2+ and equal bilaterally. Skin: Warm and dry. Neuro: No focal deficits. Psych: Normal affect. Responds appropriately.   Assessment:    No diagnosis found.  Plan:     Disposition: Follow up in ***   Signed, Kasai Beltran E Edith Lord, PA-C  10/06/2024 1:56 PM    [facility] "

## 2024-10-12 ENCOUNTER — Ambulatory Visit: Admitting: Cardiology

## 2024-10-17 ENCOUNTER — Ambulatory Visit: Admitting: Student

## 2025-03-26 ENCOUNTER — Other Ambulatory Visit: Payer: Self-pay

## 2025-03-29 ENCOUNTER — Ambulatory Visit: Payer: Self-pay | Admitting: Internal Medicine
